# Patient Record
Sex: Female | Born: 1995 | Race: White | Hispanic: No | Marital: Married | Smoking: Never smoker
Health system: Southern US, Community
[De-identification: ages and names within clinical notes are randomized; demographics above are authoritative.]

## PROBLEM LIST (undated history)

## (undated) DIAGNOSIS — J45909 Unspecified asthma, uncomplicated: Secondary | ICD-10-CM

## (undated) DIAGNOSIS — G43909 Migraine, unspecified, not intractable, without status migrainosus: Secondary | ICD-10-CM

## (undated) HISTORY — DX: Migraine, unspecified, not intractable, without status migrainosus: G43.909

## (undated) HISTORY — DX: Unspecified asthma, uncomplicated: J45.909

---
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
## 2013-08-03 HISTORY — PX: SHOULDER ARTHROSCOPY W/ SUPERIOR LABRAL ANTERIOR POSTERIOR LESION REPAIR: SHX2402

## 2014-08-03 HISTORY — PX: OTHER SURGICAL HISTORY: SHX169

## 2016-03-24 LAB — VITAMIN D 25 HYDROXY (VIT D DEFICIENCY, FRACTURES): VIT D 25 HYDROXY: 46.8

## 2016-08-03 HISTORY — PX: OTHER SURGICAL HISTORY: SHX169

## 2017-01-21 ENCOUNTER — Encounter: Payer: Self-pay | Admitting: Physician Assistant

## 2017-01-21 ENCOUNTER — Encounter: Payer: Self-pay | Admitting: Family Medicine

## 2017-08-26 LAB — BASIC METABOLIC PANEL
GLUCOSE: 86
POTASSIUM: 3.9 (ref 3.4–5.3)

## 2017-08-26 LAB — TSH: TSH: 1.35 (ref <=5.90)

## 2017-08-26 LAB — CBC AND DIFFERENTIAL: Hemoglobin: 13.3 (ref 12.0–16.0)

## 2018-03-08 ENCOUNTER — Other Ambulatory Visit (HOSPITAL_COMMUNITY)
Admission: RE | Admit: 2018-03-08 | Discharge: 2018-03-08 | Disposition: A | Discharge status: Home / Self Care | Payer: BC Managed Care – PPO | Source: Ambulatory Visit | Attending: Family Medicine | Admitting: Family Medicine

## 2018-03-08 ENCOUNTER — Encounter: Payer: Self-pay | Admitting: Family Medicine

## 2018-03-08 ENCOUNTER — Ambulatory Visit (INDEPENDENT_AMBULATORY_CARE_PROVIDER_SITE_OTHER): Payer: BC Managed Care – PPO | Admitting: Family Medicine

## 2018-03-08 VITALS — BP 102/78 | HR 85 | Temp 98.3°F | Ht 64.0 in | Wt 126.8 lb

## 2018-03-08 DIAGNOSIS — N644 Mastodynia: Secondary | ICD-10-CM

## 2018-03-08 DIAGNOSIS — F411 Generalized anxiety disorder: Secondary | ICD-10-CM | POA: Insufficient documentation

## 2018-03-08 DIAGNOSIS — Z113 Encounter for screening for infections with a predominantly sexual mode of transmission: Secondary | ICD-10-CM

## 2018-03-08 DIAGNOSIS — Z114 Encounter for screening for human immunodeficiency virus [HIV]: Secondary | ICD-10-CM | POA: Diagnosis not present

## 2018-03-08 NOTE — Progress Notes (Addendum)
Patient: Sheri Wilson MRN: [id number] DOB: 02-Aug-1996 PCP: Orland Mustard, MD     Subjective:  Chief Complaint  Patient presents with  . Establish Care  . pain in R breast    HPI: The patient is a 22 y.o. female who presents today for establishing care and right breast pain.   Right breast pain: started about 1.5 months ago. Located in upper/outer quadrant. Pain is constant and is worse with pressure/working out. She is unable to do any cardio b/c of the pain. No trauma and can not recall any precipitating event. Stays in that area and is a little under her under arm. Nothing seems to help. She has tried ibuprofen a few time and a heating pad once. This did not seem to help it. It even hurts to lay on her stomach/side. Nothing makes it better. Pain is intermittent, but when sitting would be a 1/10 and described as dull. When it's hurting it's a 6/10 and sharp in nature. She states it's really hard in the area that hurts, but no lump. No hx of breast cancer in first degree relatives. No nipple discharge. No change on her cycle to area.    GAD: on buspar and extremely well controlled. Desires no change to medication and doe snot need a refill at this time.   There is no immunization history on file for this patient.  Pap smear: 04/2017. wnl  Tdap: 2018 Gc/c: today Hiv: today    Review of Systems  Constitutional: Negative for activity change and fatigue.  Respiratory: Negative for shortness of breath.   Cardiovascular: Negative for chest pain.  Gastrointestinal: Negative for abdominal pain and nausea.  Genitourinary:       Right breast pain   Neurological: Negative for dizziness and headaches.  Psychiatric/Behavioral: Negative for sleep disturbance. The patient is not nervous/anxious.     Allergies Patient is allergic to indomethacin and sulfa antibiotics.  Past Medical History Patient  has a past medical history of Asthma and Migraines.  Surgical History Patient  has a  past surgical history that includes Arthrodesis foot with iliac crest bone graft (2011); Sesamoidectomy (Left, 2013); Shoulder arthroscopy w/ superior labral anterior posterior repair (Right, 2014); Shoulder arthroscopy w/ superior labral anterior posterior lesion repair (Right, 2015); fai repair (Right, 2016); and FAI repair (Right, 2018).  Family History [name]'s family history is not on file.  Social History Patient  reports that she has never smoked. She has never used smokeless tobacco. She reports that she drinks alcohol. She reports that she does not use drugs.    Objective: Vitals:   03/08/18 1437  BP: 102/78  Pulse: 85  Temp: 98.3 F (36.8 C)  TempSrc: Oral  SpO2: 100%  Weight: 126 lb 12.8 oz (57.5 kg)  Height: 5\' 4"  (1.626 m)    Body mass index is 21.77 kg/m.  Physical Exam  Constitutional: She is oriented to person, place, and time. She appears well-developed and well-nourished.  HENT:  Right Ear: External ear normal.  Left Ear: External ear normal.  Mouth/Throat: Oropharynx is clear and moist.  Tm pearly with light reflex bilaterally   Neck: Normal range of motion. Neck supple.  Cardiovascular: Normal rate, regular rhythm and normal heart sounds.  Pulmonary/Chest: Effort normal and breath sounds normal. Right breast exhibits tenderness. Right breast exhibits no inverted nipple, no nipple discharge and no skin change.  Area is tender and indurated to palpation. No round mass, feels more fibrous. Approximately 3-4cm in diameter.     Abdominal:  Soft. Bowel sounds are normal.  Lymphadenopathy:    She has no cervical adenopathy.    She has no axillary adenopathy.  Neurological: She is alert and oriented to person, place, and time. No cranial nerve deficit. Coordination normal.  Skin: Skin is warm and dry. No rash noted.  Vitals reviewed.  GAD 7 : Generalized Anxiety Score 03/08/2018  Nervous, Anxious, on Edge 1  Control/stop worrying 0  Worry too much - different  things 0  Trouble relaxing 0  Restless 0  Easily annoyed or irritable 0  Afraid - awful might happen 0  Total GAD 7 Score 1  Anxiety Difficulty Not difficult at all        Assessment/plan: 1. Breast pain, right Will ultrasound this. Feels more fibrous in nature. May need to see breast surgeon. Discussed ibuprofen, heat and decreasing caffeine. Will f/u on results.  - US Unlisted Procedure Breast; Future  2. Encounter for screening for HIV  - HIV antibody  3. Screen for STD (sexually transmitted disease)  - Urine cytology ancillary only; Future  4. Generalized anxiety disorder Well controlled on her buspar. GAD7 non significant and also shows her to be well controlled. Let me know when she needs refills.    Return if symptoms worsen or fail to improve.     Orland MustardAllison Donabelle Molden, MD [facility]  03/08/2018

## 2018-03-09 LAB — HIV ANTIBODY (ROUTINE TESTING W REFLEX): HIV 1&2 Ab, 4th Generation: NONREACTIVE

## 2018-03-10 LAB — URINE CYTOLOGY ANCILLARY ONLY
Chlamydia: NEGATIVE
Neisseria Gonorrhea: NEGATIVE

## 2018-03-16 NOTE — Addendum Note (Signed)
Addended by: Orland MustardWOLFE, Kael Keetch on: 03/16/2018 02:09 PM   Modules accepted: Level of Service

## 2018-03-21 ENCOUNTER — Telehealth: Payer: Self-pay | Admitting: Family Medicine

## 2018-03-21 ENCOUNTER — Other Ambulatory Visit: Payer: Self-pay

## 2018-03-21 ENCOUNTER — Encounter: Payer: Self-pay | Admitting: Family Medicine

## 2018-03-21 DIAGNOSIS — Z13220 Encounter for screening for lipoid disorders: Secondary | ICD-10-CM

## 2018-03-21 DIAGNOSIS — G43009 Migraine without aura, not intractable, without status migrainosus: Secondary | ICD-10-CM | POA: Insufficient documentation

## 2018-03-21 DIAGNOSIS — E559 Vitamin D deficiency, unspecified: Secondary | ICD-10-CM

## 2018-03-21 NOTE — Telephone Encounter (Signed)
Let her know that I got her records and no cholesterol lab check has been done. Would do this as well as a vitamin D check. If she wants to come in I can order these for her. Needs to be fasting x 8 hours, but water and coffee okay.

## 2018-03-23 NOTE — Telephone Encounter (Signed)
Called pt and informed her on 8/19 that Dr. Artis FlockWolfe has received her records, but that no cholesterol screen was tested previously. She verbalized understanding.  Lab orders put in for Lipid panel and a Vitamin D level also.  Lab appt scheduled for 8/22.

## 2018-03-24 ENCOUNTER — Other Ambulatory Visit (INDEPENDENT_AMBULATORY_CARE_PROVIDER_SITE_OTHER): Payer: BC Managed Care – PPO

## 2018-03-24 ENCOUNTER — Ambulatory Visit
Admission: RE | Admit: 2018-03-24 | Discharge: 2018-03-24 | Disposition: A | Discharge status: Home / Self Care | Payer: BC Managed Care – PPO | Source: Ambulatory Visit | Attending: Family Medicine | Admitting: Family Medicine

## 2018-03-24 DIAGNOSIS — Z13220 Encounter for screening for lipoid disorders: Secondary | ICD-10-CM

## 2018-03-24 DIAGNOSIS — N644 Mastodynia: Secondary | ICD-10-CM

## 2018-03-24 DIAGNOSIS — E559 Vitamin D deficiency, unspecified: Secondary | ICD-10-CM

## 2018-03-24 LAB — LIPID PANEL
CHOLESTEROL: 153 mg/dL (ref 0–200)
HDL: 67 mg/dL (ref >39.00)
LDL CALC: 77 mg/dL (ref 0–99)
NonHDL: 86.29
TRIGLYCERIDES: 47 mg/dL (ref 0.0–149.0)
Total CHOL/HDL Ratio: 2
VLDL: 9.4 mg/dL (ref 0.0–40.0)

## 2018-03-24 LAB — VITAMIN D 25 HYDROXY (VIT D DEFICIENCY, FRACTURES): VITD: 47.37 ng/mL (ref 30.00–100.00)

## 2018-05-11 ENCOUNTER — Ambulatory Visit (INDEPENDENT_AMBULATORY_CARE_PROVIDER_SITE_OTHER): Payer: BC Managed Care – PPO | Admitting: Family Medicine

## 2018-05-11 ENCOUNTER — Encounter: Payer: Self-pay | Admitting: Family Medicine

## 2018-05-11 VITALS — BP 104/68 | HR 70 | Temp 98.3°F | Ht 64.0 in | Wt 126.0 lb

## 2018-05-11 DIAGNOSIS — G43019 Migraine without aura, intractable, without status migrainosus: Secondary | ICD-10-CM

## 2018-05-11 DIAGNOSIS — N644 Mastodynia: Secondary | ICD-10-CM

## 2018-05-11 MED ORDER — KETOROLAC TROMETHAMINE 60 MG/2ML IM SOLN
60.0000 mg | Freq: Once | INTRAMUSCULAR | Status: AC
  Administered 2018-05-11: 60 mg via INTRAMUSCULAR

## 2018-05-11 MED ORDER — ELETRIPTAN HYDROBROMIDE 20 MG PO TABS: 20.0000 mg | ORAL_TABLET | ORAL | 0 refills | Status: DC | PRN

## 2018-05-11 NOTE — Progress Notes (Signed)
Patient: Sheri Wilson MRN: [id number] DOB: Apr 13, 1996 PCP: Orland Mustard, MD     Subjective:  Chief Complaint  Patient presents with  . right breast pain    seen 03/2018 for same issue    HPI: The patient is a 22 y.o. female who presents today for right sided breast pain which has not changed since I saw her in 03/2018. Ultrasound was normal. She now states the pain has increased in diameter and sometimes her nipple throbs. No redness or discharge. Pain not correlated with period and pain is constant. Pain rated as a 6/10 when pressure applied. Hurts even with no bra. Running with a sports bra hurts pretty bad. Nothing has helped relief the pain including nsaids, heating pad, etc.   She has a history of migraines and is currently on imitrex. She needs this weekly. She states they are increasing and she does not like the imitrex. She feels really bad and achy/weak after taking the imitrex and would like a different abortive medication.  She has kept a headache log and can not find any correlation to anything. She has never been on Topamax or any other preventative medication  . Headaches are her typical migraines. She does not get auras. Headache is located behind her eyes. She does have photophobia and phonophobia. She gets nauseated and a lot of times cant keep stuff down. She has some phenergan prn. No focal deficits, no waking her up and not worst headache of her life.    Review of Systems  Constitutional: Negative for chills and fever.  Eyes: Positive for photophobia. Negative for visual disturbance.  Respiratory: Negative for shortness of breath.   Cardiovascular: Negative for chest pain.       Right sided breast pain  Gastrointestinal: Negative for abdominal pain, diarrhea, nausea and vomiting.  Musculoskeletal: Negative for neck stiffness.  Neurological: Positive for headaches. Negative for dizziness, syncope and facial asymmetry.    Allergies Patient is allergic to  indomethacin and sulfa antibiotics.  Past Medical History Patient  has a past medical history of Asthma and Migraines.  Surgical History Patient  has a past surgical history that includes Arthrodesis foot with iliac crest bone graft (2011); Sesamoidectomy (Left, 2013); Shoulder arthroscopy w/ superior labral anterior posterior repair (Right, 2014); Shoulder arthroscopy w/ superior labral anterior posterior lesion repair (Right, 2015); fai repair (Right, 2016); and FAI repair (Right, 2018).  Family History [name]'s family history is not on file.  Social History Patient  reports that she has never smoked. She has never used smokeless tobacco. She reports that she drinks alcohol. She reports that she does not use drugs.    Objective: Vitals:   05/11/18 1449  BP: 104/68  Pulse: 70  Temp: 98.3 F (36.8 C)  TempSrc: Oral  SpO2: 99%  Weight: 126 lb (57.2 kg)  Height: 5\' 4"  (1.626 m)    Body mass index is 21.63 kg/m.  Physical Exam  Constitutional: She is oriented to person, place, and time. She appears well-developed and well-nourished.  Eyes: Pupils are equal, round, and reactive to light. Conjunctivae and EOM are normal.  Neck: Normal range of motion. Neck supple.  Cardiovascular: Normal rate, regular rhythm and normal heart sounds.  Pulmonary/Chest: Effort normal and breath sounds normal. Right breast exhibits tenderness. Right breast exhibits no inverted nipple, no nipple discharge and no skin change. Left breast exhibits no inverted nipple, no nipple discharge and no skin change.  Right breast: firm tissue that is tender on palpation on lateral/superior aspect  of breast and adjacent to nipple.     Abdominal: Soft. Bowel sounds are normal.  Neurological: She is alert and oriented to person, place, and time. No cranial nerve deficit.  Psychiatric: She has a normal mood and affect. Her behavior is normal.  Vitals reviewed.      Assessment/plan: 1. Intractable migraine  without aura and without status migrainosus toradol shot today and will change her to relpax. Let me know if too expensive. If headaches get worse may need to start a preventative medication. Already keeps a headache log. discussed if character of headache changes may need imaging. Also discussed new immunomodulator injectables for migraines. Precautions given.  - ketorolac (TORADOL) injection 60 mg  2. Breast pain, right Abnormal tissue causing her pain. Ultrasound normal. Will send her to breast surgeon for further care for imaging vs. More invasive care.  - Ambulatory referral to Plastic Surgery    Return if symptoms worsen or fail to improve.   Orland Mustard, MD [facility]   05/11/2018

## 2018-05-11 NOTE — Progress Notes (Deleted)
Patient: Sheri Wilson MRN: [id number] DOB: Aug 24, 1995 PCP: Orland Mustard, MD     Subjective:  Chief Complaint  Patient presents with  . right breast pain    seen 03/2018 for same issue    HPI: The patient is a 22 y.o. female who presents today for right breast pain. I saw her on 03/08/2018 for breast pain. Ultrasound was negative. She is continuing to have pain in the area.   Review of Systems  Allergies Patient is allergic to indomethacin and sulfa antibiotics.  Past Medical History Patient  has a past medical history of Asthma and Migraines.  Surgical History Patient  has a past surgical history that includes Arthrodesis foot with iliac crest bone graft (2011); Sesamoidectomy (Left, 2013); Shoulder arthroscopy w/ superior labral anterior posterior repair (Right, 2014); Shoulder arthroscopy w/ superior labral anterior posterior lesion repair (Right, 2015); fai repair (Right, 2016); and FAI repair (Right, 2018).  Family History [name]'s family history is not on file.  Social History Patient  reports that she has never smoked. She has never used smokeless tobacco. She reports that she drinks alcohol. She reports that she does not use drugs.    Objective: Vitals:   05/11/18 1449  BP: 104/68  Pulse: 70  Temp: 98.3 F (36.8 C)  TempSrc: Oral  SpO2: 99%  Weight: 126 lb (57.2 kg)  Height: 5\' 4"  (1.626 m)    Body mass index is 21.63 kg/m.  Physical Exam     Assessment/plan:      No follow-ups on file.     Orland Mustard, MD [facility]  05/11/2018

## 2018-05-24 ENCOUNTER — Encounter: Payer: Self-pay | Admitting: Plastic Surgery

## 2018-05-24 ENCOUNTER — Ambulatory Visit (INDEPENDENT_AMBULATORY_CARE_PROVIDER_SITE_OTHER): Payer: BC Managed Care – PPO | Admitting: Plastic Surgery

## 2018-05-24 ENCOUNTER — Other Ambulatory Visit: Payer: Self-pay

## 2018-05-24 VITALS — BP 118/82 | HR 82 | Wt 127.0 lb

## 2018-05-24 DIAGNOSIS — N6310 Unspecified lump in the right breast, unspecified quadrant: Secondary | ICD-10-CM

## 2018-05-24 DIAGNOSIS — N63 Unspecified lump in unspecified breast: Secondary | ICD-10-CM | POA: Diagnosis not present

## 2018-05-24 DIAGNOSIS — E559 Vitamin D deficiency, unspecified: Secondary | ICD-10-CM | POA: Insufficient documentation

## 2018-05-24 DIAGNOSIS — E538 Deficiency of other specified B group vitamins: Secondary | ICD-10-CM | POA: Insufficient documentation

## 2018-05-24 DIAGNOSIS — G8929 Other chronic pain: Secondary | ICD-10-CM

## 2018-05-24 DIAGNOSIS — N644 Mastodynia: Secondary | ICD-10-CM

## 2018-05-25 ENCOUNTER — Encounter: Payer: Self-pay | Admitting: Plastic Surgery

## 2018-05-25 NOTE — Progress Notes (Signed)
Patient ID: Sheri Wilson, female    DOB: 1996/03/17, 22 y.o.   MRN: [id number]   Chief Complaint  Patient presents with  . Breast Mass    6-7 months right side mass    The patient is a 22 yrs old wf here for evaluation / consultation for right breast pain.  She states it has been present for ~ 1 year.  It is getting worse and starting to wake her up at night if she rolls onto the area.  It started with tenderness and firmness at the upper outer quadrant to now include the upper central and inner quadrant.  It is firm to touch and tender.  It is ~ 4 x 6 cm in size.  There is no nipple discharge.  No sign of skin change.  The breast appears the same size as the left breast.  She denies any trauma.  She is on estrogen for migraine control per the patient.  She does not have any family history of breast cancer.  She has not had a mammogram or MRI.  She had an ultrasound and was told there was nothing of concern.  At this point nothing makes it better and it does not seem to be cyclical.    Review of Systems  Constitutional: Positive for activity change. Negative for appetite change.  HENT: Negative.   Eyes: Negative.   Respiratory: Negative.   Cardiovascular: Negative.   Gastrointestinal: Negative.  Negative for abdominal pain.  Endocrine: Negative.   Genitourinary: Negative.   Musculoskeletal: Negative.  Negative for back pain.  Skin: Negative for color change and wound.  Neurological: Positive for headaches.  Hematological: Negative.   Psychiatric/Behavioral: Negative.     Past Medical History:  Diagnosis Date  . Asthma   . Migraines     Past Surgical History:  Procedure Laterality Date  . ARTHRODESIS FOOT WITH ILIAC CREST BONE GRAFT  2011   bone graft L foot sesamoid bone  . fai repair Right 2016   FAI repair right hip  . FAI repair Right 2018   FAI repair/labrum repair right hip  . SESAMOIDECTOMY Left 2013  . SHOULDER ARTHROSCOPY W/ SUPERIOR LABRAL ANTERIOR POSTERIOR  LESION REPAIR Right 2015  . SHOULDER ARTHROSCOPY W/ SUPERIOR LABRAL ANTERIOR POSTERIOR REPAIR Right 2014      Current Outpatient Medications:  .  busPIRone (BUSPAR) 7.5 MG tablet, Take 7.5 mg by mouth daily., Disp: , Rfl:  .  eletriptan (RELPAX) 20 MG tablet, Take 1 tablet (20 mg total) by mouth as needed for migraine or headache. May repeat in 2 hours if headache persists or recurs., Disp: 10 tablet, Rfl: 0 .  Norethindrone-Ethinyl Estradiol-Fe Biphas (LO LOESTRIN FE) 1 MG-10 MCG / 10 MCG tablet, TAKE 1 TABLET BY MOUTH EVERY DAY, Disp: , Rfl:    Objective:   Vitals:   05/24/18 1506  BP: 118/82  Pulse: 82  SpO2: 97%    Physical Exam  Constitutional: She is oriented to person, place, and time. She appears well-developed and well-nourished.  HENT:  Head: Normocephalic and atraumatic.  Eyes: Pupils are equal, round, and reactive to light. EOM are normal.  Cardiovascular: Normal rate.  Pulmonary/Chest: Effort normal.  Abdominal: Soft. There is no tenderness.  Musculoskeletal: She exhibits no edema, tenderness or deformity.  Neurological: She is alert and oriented to person, place, and time.  Skin: Skin is warm. No rash noted. No erythema.  Psychiatric: She has a normal mood and affect. Her behavior is  normal. Judgment and thought content normal.    Assessment & Plan:  Breast mass, right  Chronic breast pain  Recommend GYN visit.  May need hormone meds / estrogen altered as it may be a contributing factor although not sure why it would be unilateral.  Appreciate GYN input and evaluation.  Recommend mammogram.  Referral to Gen surgery as well for evaluation.  Increase protein intake.    Alena Bills Dillingham, DO

## 2018-06-22 ENCOUNTER — Encounter: Payer: Self-pay | Admitting: Family Medicine

## 2018-06-22 ENCOUNTER — Ambulatory Visit (INDEPENDENT_AMBULATORY_CARE_PROVIDER_SITE_OTHER): Payer: BC Managed Care – PPO | Admitting: Family Medicine

## 2018-06-22 VITALS — BP 100/68 | HR 74 | Temp 98.0°F | Ht 64.0 in | Wt 128.0 lb

## 2018-06-22 DIAGNOSIS — R0982 Postnasal drip: Secondary | ICD-10-CM

## 2018-06-22 DIAGNOSIS — J029 Acute pharyngitis, unspecified: Secondary | ICD-10-CM

## 2018-06-22 LAB — POCT RAPID STREP A (OFFICE): RAPID STREP A SCREEN: NEGATIVE

## 2018-06-22 MED ORDER — FLUTICASONE PROPIONATE 50 MCG/ACT NA SUSP: 2.0000 | Freq: Every day | NASAL | 6 refills | Status: DC

## 2018-06-22 MED ORDER — BETAMETHASONE SOD PHOS & ACET 6 (3-3) MG/ML IJ SUSP
12.0000 mg | Freq: Once | INTRAMUSCULAR | Status: AC
  Administered 2018-06-22: 12 mg via INTRAMUSCULAR

## 2018-06-22 NOTE — Patient Instructions (Signed)

## 2018-06-22 NOTE — Progress Notes (Signed)
Patient: Sheri Wilson MRN: [id number] DOB: 01/28/1996 PCP: Orland MustardWolfe, Kiara Mcdowell, MD     Subjective:  Chief Complaint  Patient presents with  . Sore Throat  . Generalized Body Aches    HPI: The patient is a 22 y.o. female who presents today for sore throat and body aches. She started feeling bad yesterday with a sore throat. She states it got worse last night and woke up and it was really bad this AM with pain and body aches. She has a mild cough that started about 2 hours ago that is dry in nature. She has nasal congestion, but denies any sinus pain/pressure. One of her kids is sick at school. No one else is sick. 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 She took tylenol around 5:30AM. No N/V/D. No abdominal pain. No shortness of breath or wheezing.   Review of Systems  Constitutional: Negative for chills and fever.  HENT: Positive for congestion and sore throat. Negative for ear pain, rhinorrhea, sinus pressure and sinus pain.   Eyes: Negative for itching.  Respiratory: Positive for cough. Negative for shortness of breath and wheezing.   Gastrointestinal: Negative for abdominal pain, nausea and vomiting.    Allergies Patient is allergic to indomethacin and sulfa antibiotics.  Past Medical History Patient  has a past medical history of Asthma and Migraines.  Surgical History Patient  has a past surgical history that includes Arthrodesis foot with iliac crest bone graft (2011); Sesamoidectomy (Left, 2013); Shoulder arthroscopy w/ superior labral anterior posterior repair (Right, 2014); Shoulder arthroscopy w/ superior labral anterior posterior lesion repair (Right, 2015); fai repair (Right, 2016); and FAI repair (Right, 2018).  Family History [name]'s family history is not on file.  Social History Patient  reports that she has never smoked. She has never used smokeless tobacco. She reports that she drinks alcohol. She reports that she does not use  drugs.    Objective: Vitals:   06/22/18 1132  BP: 100/68  Pulse: 74  Temp: 98 F (36.7 C)  TempSrc: Oral  SpO2: 97%  Weight: 128 lb (58.1 kg)  Height: 5\' 4"  (1.626 m)    Body mass index is 21.97 kg/m.  Physical Exam  Constitutional: She appears well-developed and well-nourished.  HENT:  Right Ear: Tympanic membrane and ear canal normal.  Left Ear: Tympanic membrane and ear canal normal.  Mouth/Throat: Mucous membranes are normal. No oral lesions. Posterior oropharyngeal erythema present. No oropharyngeal exudate or posterior oropharyngeal edema. Tonsils are 0 on the right. Tonsils are 0 on the left. No tonsillar exudate.  +cobblestoning on posterior pharynx No exudate on tonsils   Neck: Normal range of motion.  Cardiovascular: Normal rate, regular rhythm and normal heart sounds.  Pulmonary/Chest: Effort normal and breath sounds normal.  Abdominal: Soft. Bowel sounds are normal.  Lymphadenopathy:    She has no cervical adenopathy.  Skin: No rash noted.  Vitals reviewed.     Rapid strep: negative   Assessment/plan:  1) post nasal drip Starting flonase daily. Discussed this can make her throat very sore.   2. Viral pharyngitis Steroid shot and conservative therapy with cool mist humidifier, warm salt water gurgles, honey and otc medication. Fever, chills, worsening symptoms she is to let me know.  - betamethasone acetate-betamethasone sodium phosphate (CELESTONE) injection 12 mg   Return if symptoms worsen or fail to improve.   Orland MustardAllison Ivaan Liddy, MD [facility]   06/22/2018

## 2018-06-27 ENCOUNTER — Ambulatory Visit (INDEPENDENT_AMBULATORY_CARE_PROVIDER_SITE_OTHER): Payer: BC Managed Care – PPO | Admitting: Physician Assistant

## 2018-06-27 ENCOUNTER — Encounter: Payer: Self-pay | Admitting: Physician Assistant

## 2018-06-27 VITALS — BP 108/76 | HR 70 | Temp 98.4°F | Ht 64.0 in | Wt 127.2 lb

## 2018-06-27 DIAGNOSIS — R05 Cough: Secondary | ICD-10-CM | POA: Diagnosis not present

## 2018-06-27 DIAGNOSIS — R059 Cough, unspecified: Secondary | ICD-10-CM

## 2018-06-27 MED ORDER — AZITHROMYCIN 250 MG PO TABS: ORAL_TABLET | ORAL | 0 refills | Status: DC

## 2018-06-27 MED ORDER — HYDROCOD POLST-CPM POLST ER 10-8 MG/5ML PO SUER: 5.0000 mL | Freq: Every evening | ORAL | 0 refills | Status: DC | PRN

## 2018-06-27 NOTE — Patient Instructions (Signed)
It was great to see you!  Please use the prescription cough syrup as directed, only at home, as it can make you drowsy.  If no improvement, as we discussed, may use antibiotic.  Push fluids and get plenty of rest. Please return if you are not improving as expected, or if you have high fevers (>101.5) or difficulty swallowing or worsening productive cough.  Call clinic with questions.  I hope you start feeling better soon!

## 2018-06-27 NOTE — Progress Notes (Signed)
Sheri Wilson is a 22 y.o. female here for a new problem.  History of Present Illness:   Chief Complaint  Patient presents with  . Cough    Hoarseness,headached    HPI   Patient was seen by her PCP on 11/20 and was given a depomedrol injection as well as a prescription of flonase. She has been tolerating this well but now has a cough that is keeping her up at night. Her voice has returned but remains hoarse, she suspects, because she is not sleeping well from coughing all night. She tried Delsym and it is not working well. Has a past history of asthma that she "grew out of." Denies: fevers, chills, CP, SOB.    Past Medical History:  Diagnosis Date  . Asthma   . Migraines      Social History   Socioeconomic History  . Marital status: Single    Spouse name: Not on file  . Number of children: Not on file  . Years of education: Not on file  . Highest education level: Not on file  Occupational History  . Not on file  Social Needs  . Financial resource strain: Not on file  . Food insecurity:    Worry: Not on file    Inability: Not on file  . Transportation needs:    Medical: Not on file    Non-medical: Not on file  Tobacco Use  . Smoking status: Never Smoker  . Smokeless tobacco: Never Used  Substance and Sexual Activity  . Alcohol use: Yes    Comment: socially  . Drug use: Never  . Sexual activity: Yes  Lifestyle  . Physical activity:    Days per week: Not on file    Minutes per session: Not on file  . Stress: Not on file  Relationships  . Social connections:    Talks on phone: Not on file    Gets together: Not on file    Attends religious service: Not on file    Active member of club or organization: Not on file    Attends meetings of clubs or organizations: Not on file    Relationship status: Not on file  . Intimate partner violence:    Fear of current or ex partner: Not on file    Emotionally abused: Not on file    Physically abused: Not on file   Forced sexual activity: Not on file  Other Topics Concern  . Not on file  Social History Narrative  . Not on file    Past Surgical History:  Procedure Laterality Date  . ARTHRODESIS FOOT WITH ILIAC CREST BONE GRAFT  2011   bone graft L foot sesamoid bone  . fai repair Right 2016   FAI repair right hip  . FAI repair Right 2018   FAI repair/labrum repair right hip  . SESAMOIDECTOMY Left 2013  . SHOULDER ARTHROSCOPY W/ SUPERIOR LABRAL ANTERIOR POSTERIOR LESION REPAIR Right 2015  . SHOULDER ARTHROSCOPY W/ SUPERIOR LABRAL ANTERIOR POSTERIOR REPAIR Right 2014    History reviewed. No pertinent family history.  Allergies  Allergen Reactions  . Indomethacin Other (See Comments)    Vision impairment  . Sulfa Antibiotics Hives    Current Medications:   Current Outpatient Medications:  .  busPIRone (BUSPAR) 7.5 MG tablet, Take 7.5 mg by mouth daily., Disp: , Rfl:  .  eletriptan (RELPAX) 20 MG tablet, Take 1 tablet (20 mg total) by mouth as needed for migraine or headache. May repeat in 2  hours if headache persists or recurs., Disp: 10 tablet, Rfl: 0 .  fluticasone (FLONASE) 50 MCG/ACT nasal spray, Place 2 sprays into both nostrils daily., Disp: 16 g, Rfl: 6 .  Norethindrone-Ethinyl Estradiol-Fe Biphas (LO LOESTRIN FE) 1 MG-10 MCG / 10 MCG tablet, TAKE 1 TABLET BY MOUTH EVERY DAY, Disp: , Rfl:  .  azithromycin (ZITHROMAX) 250 MG tablet, Take two tablets daily on day 1, then one tablet daily thereafter, Disp: 6 tablet, Rfl: 0 .  chlorpheniramine-HYDROcodone (TUSSIONEX PENNKINETIC ER) 10-8 MG/5ML SUER, Take 5 mLs by mouth at bedtime as needed for cough., Disp: 140 mL, Rfl: 0   Review of Systems:   ROS  Negative unless otherwise specified per HPI.  Vitals:   Vitals:   06/27/18 1441  BP: 108/76  Pulse: 70  Temp: 98.4 F (36.9 C)  TempSrc: Oral  SpO2: 99%  Weight: 127 lb 4 oz (57.7 kg)  Height: 5\' 4"  (1.626 m)     Body mass index is 21.84 kg/m.  Physical Exam:   Physical  Exam  Constitutional: She appears well-developed. She is cooperative.  Non-toxic appearance. She does not have a sickly appearance. She does not appear ill. No distress.  HENT:  Head: Normocephalic and atraumatic.  Right Ear: External ear and ear canal normal. Tympanic membrane is not erythematous, not retracted and not bulging. A middle ear effusion (fluid appears clear) is present.  Left Ear: Tympanic membrane, external ear and ear canal normal. Tympanic membrane is not erythematous, not retracted and not bulging.  Nose: Mucosal edema and rhinorrhea present. Right sinus exhibits no maxillary sinus tenderness and no frontal sinus tenderness. Left sinus exhibits no maxillary sinus tenderness and no frontal sinus tenderness.  Mouth/Throat: Uvula is midline and mucous membranes are normal. Posterior oropharyngeal erythema present. No posterior oropharyngeal edema. Tonsils are 0 on the right. Tonsils are 0 on the left.  Eyes: Conjunctivae and lids are normal.  Neck: Trachea normal.  Cardiovascular: Normal rate, regular rhythm, S1 normal, S2 normal and normal heart sounds.  Pulmonary/Chest: Effort normal and breath sounds normal. She has no decreased breath sounds. She has no wheezes. She has no rhonchi. She has no rales.  Lungs clear to auscultation bilaterally, good breath sounds throughout   Lymphadenopathy:    She has no cervical adenopathy.  Neurological: She is alert.  Skin: Skin is warm, dry and intact.  Psychiatric: She has a normal mood and affect. Her speech is normal and behavior is normal.  Nursing note and vitals reviewed.   Assessment and Plan:   Claris GowerCharlotte was seen today for cough.  Diagnoses and all orders for this visit:  Cough  Other orders -     chlorpheniramine-HYDROcodone (TUSSIONEX PENNKINETIC ER) 10-8 MG/5ML SUER; Take 5 mLs by mouth at bedtime as needed for cough. -     azithromycin (ZITHROMAX) 250 MG tablet; Take two tablets daily on day 1, then one tablet daily  thereafter   No red flags on exam.  Will initiate Tussionex per orders -- advised precautions regarding this medication in regards to drowsiness potential. I also provided her with a pocket prescriptioin of azithromycin should her symptoms not improve or worsen throughout the holiday weekend. Discussed taking medications as prescribed. Reviewed return precautions including worsening fever, SOB, worsening cough or other concerns. Push fluids and rest. I recommend that patient follow-up if symptoms worsen or persist despite treatment x 7-10 days, sooner if needed.  . Reviewed expectations re: course of current medical issues. . Discussed self-management of  symptoms. . Outlined signs and symptoms indicating need for more acute intervention. . Patient verbalized understanding and all questions were answered. . See orders for this visit as documented in the electronic medical record. . Patient received an After-Visit Summary.  Jarold Motto, PA-C

## 2018-07-03 HISTORY — PX: BREAST SURGERY: SHX581

## 2018-07-05 ENCOUNTER — Encounter: Payer: Self-pay | Admitting: Physician Assistant

## 2018-07-05 ENCOUNTER — Ambulatory Visit (INDEPENDENT_AMBULATORY_CARE_PROVIDER_SITE_OTHER): Payer: BC Managed Care – PPO | Admitting: Physician Assistant

## 2018-07-05 VITALS — BP 110/62 | HR 78 | Temp 99.0°F | Ht 64.0 in | Wt 129.4 lb

## 2018-07-05 DIAGNOSIS — H1031 Unspecified acute conjunctivitis, right eye: Secondary | ICD-10-CM | POA: Diagnosis not present

## 2018-07-05 MED ORDER — POLYMYXIN B-TRIMETHOPRIM 10000-0.1 UNIT/ML-% OP SOLN: 1.0000 [drp] | Freq: Four times a day (QID) | OPHTHALMIC | 0 refills | Status: DC

## 2018-07-05 NOTE — Progress Notes (Signed)
Sheri Wilson is a 22 y.o. female here for a new problem.  History of Present Illness:   Chief Complaint  Patient presents with  . Eye Drainage    Eye Pain   Both eyes are affected.This is a new problem. The current episode started today. The problem occurs constantly. The problem has been gradually worsening. There was no injury mechanism. The pain is mild. 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 She does not wear contacts. Associated symptoms include eye redness and itching. Pertinent negatives include no blurred vision or eye discharge. She has tried nothing for the symptoms.   Denies changes in vision, intense pain or photosensitivity.   Past Medical History:  Diagnosis Date  . Asthma   . Migraines      Social History   Socioeconomic History  . Marital status: Single    Spouse name: Not on file  . Number of children: Not on file  . Years of education: Not on file  . Highest education level: Not on file  Occupational History  . Not on file  Social Needs  . Financial resource strain: Not on file  . Food insecurity:    Worry: Not on file    Inability: Not on file  . Transportation needs:    Medical: Not on file    Non-medical: Not on file  Tobacco Use  . Smoking status: Never Smoker  . Smokeless tobacco: Never Used  Substance and Sexual Activity  . Alcohol use: Yes    Comment: socially  . Drug use: Never  . Sexual activity: Yes  Lifestyle  . Physical activity:    Days per week: Not on file    Minutes per session: Not on file  . Stress: Not on file  Relationships  . Social connections:    Talks on phone: Not on file    Gets together: Not on file    Attends religious service: Not on file    Active member of club or organization: Not on file    Attends meetings of clubs or organizations: Not on file    Relationship status: Not on file  . Intimate partner violence:    Fear of current or ex partner: Not on file     Emotionally abused: Not on file    Physically abused: Not on file    Forced sexual activity: Not on file  Other Topics Concern  . Not on file  Social History Narrative  . Not on file    Past Surgical History:  Procedure Laterality Date  . ARTHRODESIS FOOT WITH ILIAC CREST BONE GRAFT  2011   bone graft L foot sesamoid bone  . fai repair Right 2016   FAI repair right hip  . FAI repair Right 2018   FAI repair/labrum repair right hip  . SESAMOIDECTOMY Left 2013  . SHOULDER ARTHROSCOPY W/ SUPERIOR LABRAL ANTERIOR POSTERIOR LESION REPAIR Right 2015  . SHOULDER ARTHROSCOPY W/ SUPERIOR LABRAL ANTERIOR POSTERIOR REPAIR Right 2014    No family history on file.  Allergies  Allergen Reactions  . Indomethacin Other (See Comments)    Vision impairment  . Sulfa Antibiotics Hives    Current Medications:   Current Outpatient Medications:  .  busPIRone (BUSPAR) 7.5 MG tablet, Take 7.5 mg by mouth daily., Disp: , Rfl:  .  eletriptan (RELPAX) 20 MG tablet, Take 1 tablet (20 mg total) by mouth as needed for migraine or headache. May repeat in 2 hours  if headache persists or recurs., Disp: 10 tablet, Rfl: 0 .  Norethindrone-Ethinyl Estradiol-Fe Biphas (LO LOESTRIN FE) 1 MG-10 MCG / 10 MCG tablet, TAKE 1 TABLET BY MOUTH EVERY DAY, Disp: , Rfl:  .  trimethoprim-polymyxin b (POLYTRIM) ophthalmic solution, Place 1 drop into both eyes every 6 (six) hours., Disp: 10 mL, Rfl: 0   Review of Systems:   Review of Systems  Eyes: Positive for pain, redness and itching. Negative for blurred vision and discharge.    Vitals:   Vitals:   07/05/18 1517  BP: 110/62  Pulse: 78  Temp: 99 F (37.2 C)  TempSrc: Oral  SpO2: 99%  Weight: 129 lb 6.4 oz (58.7 kg)  Height: 5\' 4"  (1.626 m)     Body mass index is 22.21 kg/m.  Physical Exam:   Physical Exam  Constitutional: She appears well-developed. She is cooperative.  Non-toxic appearance. She does not have a sickly appearance. She does not  appear ill. No distress.  HENT:  Head: Normocephalic and atraumatic.  Right Ear: Tympanic membrane, external ear and ear canal normal. Tympanic membrane is not erythematous, not retracted and not bulging.  Left Ear: Tympanic membrane, external ear and ear canal normal. Tympanic membrane is not erythematous, not retracted and not bulging.  Nose: Nose normal. Right sinus exhibits no maxillary sinus tenderness and no frontal sinus tenderness. Left sinus exhibits no maxillary sinus tenderness and no frontal sinus tenderness.  Mouth/Throat: Uvula is midline. No posterior oropharyngeal edema or posterior oropharyngeal erythema.  Eyes: Pupils are equal, round, and reactive to light. EOM and lids are normal. Right conjunctiva is injected. Right conjunctiva has no hemorrhage. Left conjunctiva is injected. Left conjunctiva has no hemorrhage. Right eye exhibits normal extraocular motion. Left eye exhibits normal extraocular motion.  Bilateral conjunctivae with redness R>>L  Neck: Trachea normal.  Cardiovascular: Normal rate, regular rhythm, S1 normal, S2 normal and normal heart sounds.  Pulmonary/Chest: Effort normal and breath sounds normal. She has no decreased breath sounds. She has no wheezes. She has no rhonchi. She has no rales.  Lymphadenopathy:    She has no cervical adenopathy.  Neurological: She is alert.  Skin: Skin is warm, dry and intact.  Psychiatric: She has a normal mood and affect. Her speech is normal and behavior is normal.  Nursing note and vitals reviewed.    Assessment and Plan:   Sheri Wilson was seen today for eye drainage.  Diagnoses and all orders for this visit:  Acute conjunctivitis of right eye, unspecified acute conjunctivitis type  Other orders -     trimethoprim-polymyxin b (POLYTRIM) ophthalmic solution; Place 1 drop into both eyes every 6 (six) hours.   No red flags on exam.  Will initiate Polytrim per orders. Discussed using medications as prescribed. Reviewed  return precautions including development of fever, SOB, worsening cough or other concerns. Push fluids and rest. I recommend that patient follow-up if symptoms worsen or persist despite treatment x 7-10 days, sooner if needed.  . Reviewed expectations re: course of current medical issues. . Discussed self-management of symptoms. . Outlined signs and symptoms indicating need for more acute intervention. . Patient verbalized understanding and all questions were answered. . See orders for this visit as documented in the electronic medical record. . Patient received an After-Visit Summary.  CMA or LPN served as scribe during this visit. History, Physical, and Plan performed by medical provider. The above documentation has been reviewed and is accurate and complete.  Jarold Motto, PA-C

## 2018-07-05 NOTE — Patient Instructions (Signed)
It was great to see you!  Start the eye drops. Keep us posted on your symptoms.  Take care,  Jarold MottoSamantha Jonovan Boedecker PA-C

## 2018-07-11 ENCOUNTER — Telehealth: Payer: Self-pay | Admitting: Plastic Surgery

## 2018-07-11 ENCOUNTER — Encounter: Payer: Self-pay | Admitting: Family Medicine

## 2018-07-11 NOTE — Telephone Encounter (Signed)
Received call from patient at noon inquiring about mammogram orders. Explained to patient that I would work on same as well as referral to OB/GYN and General Surgery as she explained that she is new to the area. Patient referral placed to OB/GYN and contacted for appointment. Referred patient to [facility] with an appointment on Thursday December 12th at 8:30. Attempted to contact patient and left voice message.  In regards to the mammogram, the [facility] will not preform a screening or diagnostic mammogram for anyone under the age of 24 without immediate family history of breast cancer. I contacted Dr. Ulice Boldillingham and she is placing orders for an MRI as an ultrasound has already been performed in August. [employer] has authorized same. Will continue to try to contact patient with this information.

## 2018-07-12 NOTE — Addendum Note (Signed)
Addended by: Peggye FormILLINGHAM, Peirce Deveney S on: 07/12/2018 01:31 PM   Modules accepted: Orders

## 2018-07-12 NOTE — Addendum Note (Signed)
Addended by: Peggye FormILLINGHAM, CLAIRE S on: 07/12/2018 01:16 PM   Modules accepted: Orders

## 2018-07-13 ENCOUNTER — Encounter: Payer: Self-pay | Admitting: Obstetrics & Gynecology

## 2018-07-13 ENCOUNTER — Ambulatory Visit (INDEPENDENT_AMBULATORY_CARE_PROVIDER_SITE_OTHER): Payer: BC Managed Care – PPO | Admitting: Obstetrics & Gynecology

## 2018-07-13 ENCOUNTER — Telehealth: Payer: Self-pay

## 2018-07-13 VITALS — BP 126/66 | HR 71 | Ht 64.0 in | Wt 128.0 lb

## 2018-07-13 DIAGNOSIS — N6311 Unspecified lump in the right breast, upper outer quadrant: Secondary | ICD-10-CM

## 2018-07-13 NOTE — Telephone Encounter (Signed)
Patient called and made aware that Dr. Erin FullingHarraway-Smith consulted with radiology and placed orders for repeat breast US and scheduling fine needle aspiration same day. Patient made aware she should expect a call from the breast center to schedule this. Patient states understanding. Armandina StammerJennifer Macgregor Aeschliman RN

## 2018-07-13 NOTE — Patient Instructions (Signed)

## 2018-07-13 NOTE — Progress Notes (Signed)
History:  22 y.o. G0  here today for eval of right breast pain and beat lump. She reports that now her entire right breast hurts on the right upper and outer.  She reports that these sx have been present for >8 months. The area appear to be getting larger and is no more painful. Pt reports that she drinks 1/4 cup of coffee daily.  No h/o known trauma.  Pt is on LoEstrin Fe for >10 years.  Pt denies FH of breast dieseas. No nipple discharge. Pt has a h/o migrtains but, deies changes recently. She had breast imagine >6 months prev. She was also scheduled for a MRI of the breast but, she is worried about cost and the benefits. She was inadvertently scheduled to see a Engineer, petroleumlastic surgeon who reported that she needed to be seen by GYN. She is a bit frustrated by that visit as it was not indicated.    Pt works as a first Psychiatristgrade school teacher.   The following portions of the patient's history were reviewed and updated as appropriate: allergies, current medications, past family history, past medical history, past social history, past surgical history and problem list.  Review of Systems:  Pertinent items are noted in HPI.    Objective:  Physical Exam Blood pressure 126/66, pulse 71, height 5\' 4"  (1.626 m), weight 128 lb (58.1 kg), last menstrual period 07/01/2018.  CONSTITUTIONAL: Well-developed, well-nourished pleasant female in no acute distress.  HENT:  Normocephalic, atraumatic EYES: Conjunctivae and EOM are normal. No scleral icterus.  NECK: Normal range of motion SKIN: Skin is warm and dry. No rash noted. Not diaphoretic.No pallor. NEUROLGIC: Alert and oriented to person, place, and time. Normal coordination.   Breast: bilaterally without skin changes, axillary nodules or nipple discharge. The right upper outer quadrant has an area of ~3x3cm which is a poorly defined region of dense tissue with increased density. This area is mobile. It is slightly tender.    03/24/2018 CLINICAL DATA:  22 year old  female presenting for evaluation of worsening pain and firmness in the upper-outer quadrant of the right breast. No change in hormone status. No family history of breast cancer.  EXAM: ULTRASOUND OF THE RIGHT BREAST  COMPARISON:  None.  FINDINGS: On physical exam, there is firm tender tissue in the upper-outer quadrant of the right breast without a discrete palpable mass.  Targeted ultrasound is performed, showing normal fibroglandular tissue in the upper-outer quadrant of the right breast. No masses or suspicious areas of shadowing are identified.  IMPRESSION: Normal targeted ultrasound of the upper-outer quadrant of the right breast. No findings to explain the patient's tenderness and firmness.  RECOMMENDATION: 1. Clinical follow-up recommended for the tender palpable area of concern in the upper-outer right breast. Any further workup should be based on clinical grounds.  2. Screening mammogram at age 89 unless there are persistent or intervening clinical concerns. (Code:SM-B-40A)  I have discussed the findings and recommendations with the patient. Results were also provided in writing at the conclusion of the visit. If applicable, a reminder letter will be sent to the patient regarding the next appointment.  BI-RADS CATEGORY  1: Negative.   Assessment & Plan:  Breast mass- discussed with radiologist. Will schedule for repeat US with US guided bx.   rec f/u in 1 month or sooner prn  Total face-to-face time with patient was 20 min.  Greater than 50% was spent in counseling and coordination of care with the patient.   Willis Holquin L. Harraway-Smith, M.D., Evern CoreFACOG

## 2018-07-13 NOTE — Progress Notes (Signed)
Patient has been having breast pain since May 2019. Patient states that it is a achy pain. Patient states it is only on right side. Ultrasound of right breast done 03-2018. Patient is scheduled for breast MRI 07-28-18 but wanted OBGYN opinion and general surgeon opinon. Armandina StammerJennifer Alick Lecomte RN

## 2018-07-14 ENCOUNTER — Other Ambulatory Visit: Payer: Self-pay | Admitting: Obstetrics & Gynecology

## 2018-07-14 DIAGNOSIS — N6311 Unspecified lump in the right breast, upper outer quadrant: Secondary | ICD-10-CM

## 2018-07-15 ENCOUNTER — Encounter: Payer: Self-pay | Admitting: Obstetrics & Gynecology

## 2018-07-18 ENCOUNTER — Ambulatory Visit
Admission: RE | Admit: 2018-07-18 | Discharge: 2018-07-18 | Disposition: A | Discharge status: Home / Self Care | Payer: BC Managed Care – PPO | Source: Ambulatory Visit | Attending: Obstetrics & Gynecology | Admitting: Obstetrics & Gynecology

## 2018-07-18 DIAGNOSIS — N6311 Unspecified lump in the right breast, upper outer quadrant: Secondary | ICD-10-CM

## 2018-07-28 ENCOUNTER — Other Ambulatory Visit: Payer: BC Managed Care – PPO

## 2018-08-24 ENCOUNTER — Encounter: Payer: Self-pay | Admitting: Obstetrics & Gynecology

## 2018-08-24 ENCOUNTER — Ambulatory Visit (INDEPENDENT_AMBULATORY_CARE_PROVIDER_SITE_OTHER): Payer: BC Managed Care – PPO | Admitting: Obstetrics & Gynecology

## 2018-08-24 VITALS — BP 115/63 | HR 76 | Ht 64.0 in | Wt 132.0 lb

## 2018-08-24 DIAGNOSIS — N6310 Unspecified lump in the right breast, unspecified quadrant: Secondary | ICD-10-CM

## 2018-08-24 DIAGNOSIS — N6489 Other specified disorders of breast: Secondary | ICD-10-CM | POA: Diagnosis not present

## 2018-08-24 NOTE — Progress Notes (Signed)
History:  23 y.o. Go here for f/u of breast tenderness and breast mass. Pt is s/p a needle bx but, was not told the full results just that the results were benign. Pt reports that since the bx the area is sore with occ shooting pains. No sx currently.     The following portions of the patient's history were reviewed and updated as appropriate: allergies, current medications, past family history, past medical history, past social history, past surgical history and problem list.  Review of Systems:  Pertinent items are noted in HPI.    Objective:  Physical Exam Blood pressure 115/63, pulse 76, height 5\' 4"  (1.626 m), weight 132 lb (59.9 kg), last menstrual period 08/12/2018.  CONSTITUTIONAL: Well-developed, well-nourished female in no acute distress.  HENT:  Normocephalic, atraumatic EYES: Conjunctivae and EOM are normal. No scleral icterus.  NECK: Normal range of motion SKIN: Skin is warm and dry. No rash noted. Not diaphoretic.No pallor. NEUROLGIC: Alert and oriented to person, place, and time. Normal coordination.    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 This was found to be concordant by Dr. Amie Portland.  Pathology results were discussed with the patient by telephone. The patient reported doing well after the biopsy with tenderness and bruising at the site. Post biopsy instructions and care were reviewed and questions were answered. The patient was encouraged to call [facility] for any additional concerns.  The patient was instructed to continue with monthly self breast examinations, clinical follow-up as needed, and to return for annual mammography at 38.  Pathology  results reported by Rene Kocher, RN on 07/19/2018.  Assessment & Plan:  Breast mass- benign  Reviewed surg path PSEUDOANGIOMATOUS STROMAL HYPERPLASIA, (PASH)   F/u in 7 months for annual  Total face-to-face time with patient was .  Greater than 50% was spent in counseling and coordination of care with the patient.   Fantasha Daniele L. Harraway-Smith, M.D., [place] [place]

## 2018-08-25 ENCOUNTER — Encounter: Payer: Self-pay | Admitting: Obstetrics & Gynecology

## 2018-09-05 ENCOUNTER — Encounter: Payer: Self-pay | Admitting: Family Medicine

## 2018-09-05 ENCOUNTER — Ambulatory Visit (INDEPENDENT_AMBULATORY_CARE_PROVIDER_SITE_OTHER): Payer: BC Managed Care – PPO | Admitting: Family Medicine

## 2018-09-05 VITALS — BP 118/78 | HR 74 | Temp 98.4°F | Ht 64.0 in | Wt 131.4 lb

## 2018-09-05 DIAGNOSIS — F411 Generalized anxiety disorder: Secondary | ICD-10-CM

## 2018-09-05 MED ORDER — ELETRIPTAN HYDROBROMIDE 20 MG PO TABS: 20.0000 mg | ORAL_TABLET | ORAL | 2 refills | Status: DC | PRN

## 2018-09-05 MED ORDER — HYDROXYZINE HCL 25 MG PO TABS: 25.0000 mg | ORAL_TABLET | Freq: Three times a day (TID) | ORAL | 0 refills | Status: DC | PRN

## 2018-09-05 MED ORDER — BUSPIRONE HCL 15 MG PO TABS: 15.0000 mg | ORAL_TABLET | Freq: Two times a day (BID) | ORAL | 1 refills | Status: DC

## 2018-09-05 NOTE — Patient Instructions (Signed)
Increasing buspar to 15mg  BID. Sent in new px for this  hydroxzyine prn. Will make you drowsy. Can half tablet if you need to and take up to three times a day.   email me if you need something else.

## 2018-09-05 NOTE — Progress Notes (Signed)
Patient: Sheri Wilson MRN: [id number] DOB: [birth year] PCP: Orland Mustard, MD     Subjective:  Chief Complaint  Patient presents with  . Anxiety    HPI: The patient is a 23 y.o. female who presents today for anxiety.   She has a history of anxiety. She states it has gotten worse over this last year due to issues at work. It's keep her up at night and is really creating stress in her life. She wakes up in a full panic about going to work. She has had a history of anxiety since she was in high school. Was on SSRI for years and tapered off. She states it got worse her senior year of college. She went on prozac and got bad night sweats so she was put on another SSRI. It didn't work well again due to night sweats. At this point they changed to buspar and has been on it since day. She is having panic attacks at night that are what are waking her up. She never had panic attacks before this. Everything has been manageable until recently. She denies any hx of depression.   Review of Systems  Constitutional: Positive for fatigue.  Respiratory: Negative for shortness of breath.   Cardiovascular: Negative for chest pain and palpitations.  Gastrointestinal: Negative for abdominal pain and nausea.  Neurological: Positive for headaches. Negative for dizziness.  Psychiatric/Behavioral: Positive for sleep disturbance. Negative for dysphoric mood. The patient is nervous/anxious.     Allergies Patient is allergic to indomethacin and sulfa antibiotics.  Past Medical History Patient  has a past medical history of Asthma and Migraines.  Surgical History Patient  has a past surgical history that includes Arthrodesis foot with iliac crest bone graft (2011); Sesamoidectomy (Left, 2013); Shoulder arthroscopy w/ superior labral anterior posterior repair (Right, 2014); Shoulder arthroscopy w/ superior labral anterior posterior lesion repair (Right, 2015); fai repair (Right, 2016); FAI repair (Right, 2018);  and Breast surgery (07/2018).  Family History [name]'s family history is not on file.  Social History Patient  reports that she has never smoked. She has never used smokeless tobacco. She reports current alcohol use. She reports that she does not use drugs.    Objective: Vitals:   09/05/18 1429  BP: 118/78  Pulse: 74  Temp: 98.4 F (36.9 C)  TempSrc: Oral  SpO2: 98%  Weight: 131 lb 6.4 oz (59.6 kg)  Height: 5\' 4"  (1.626 m)    Body mass index is 22.55 kg/m.  Physical Exam Vitals signs reviewed.  Constitutional:      Appearance: Normal appearance.  Neck:     Musculoskeletal: Normal range of motion and neck supple.  Cardiovascular:     Rate and Rhythm: Normal rate and regular rhythm.     Heart sounds: Normal heart sounds.  Pulmonary:     Effort: Pulmonary effort is normal.     Breath sounds: Normal breath sounds.  Abdominal:     General: Abdomen is flat. Bowel sounds are normal.     Palpations: Abdomen is soft.  Skin:    General: Skin is warm and dry.     Capillary Refill: Capillary refill takes less than 2 seconds.  Neurological:     General: No focal deficit present.     Mental Status: She is alert and oriented to person, place, and time.         GAD 7 : Generalized Anxiety Score 09/05/2018 03/08/2018  Nervous, Anxious, on Edge 3 1  Control/stop worrying 3 0  Worry  too much - different things 1 0  Trouble relaxing 3 0  Restless 1 0  Easily annoyed or irritable 1 0  Afraid - awful might happen 1 0  Total GAD 7 Score 13 1  Anxiety Difficulty Somewhat difficult Not difficult at all     Office Visit from 09/05/2018 in [facility]  PHQ-9 Total Score  12       Assessment/plan: 1. Generalized anxiety disorder We are going to increase her buspar to 15mg  BID and add on hydroxyzine prn. This is situational secondary to school situation so she would like to hold off on adding a SSRI back. Discussed if still not managing she will need to have  this done. Discussed Celexa or effexor if need to add in future. She can email me if we need to add on more medication. She is looking for another job as well that will help with anxiety. Did discuss counseling, but she doesn't have time to do this at this point.     Return if symptoms worsen or fail to improve.   Orland MustardAllison Montray Kliebert, MD [facility]   09/05/2018

## 2018-09-27 ENCOUNTER — Other Ambulatory Visit: Payer: Self-pay | Admitting: Family Medicine

## 2018-10-12 ENCOUNTER — Ambulatory Visit: Payer: BC Managed Care – PPO | Admitting: Family Medicine

## 2018-10-17 ENCOUNTER — Ambulatory Visit: Payer: BC Managed Care – PPO | Admitting: Family Medicine

## 2018-10-28 ENCOUNTER — Telehealth: Payer: Self-pay

## 2018-10-28 NOTE — Telephone Encounter (Signed)
Left detailed voicemail message on patient's cell phone in regards to upcoming appt w/Dr. Artis Flock on 3/30.  Requested a call back from pt.

## 2018-10-31 ENCOUNTER — Encounter: Payer: Self-pay | Admitting: Family Medicine

## 2018-10-31 ENCOUNTER — Ambulatory Visit (INDEPENDENT_AMBULATORY_CARE_PROVIDER_SITE_OTHER): Payer: BC Managed Care – PPO | Admitting: Family Medicine

## 2018-10-31 DIAGNOSIS — G43019 Migraine without aura, intractable, without status migrainosus: Secondary | ICD-10-CM | POA: Diagnosis not present

## 2018-10-31 MED ORDER — TOPIRAMATE 25 MG PO TABS: 25.0000 mg | ORAL_TABLET | Freq: Every day | ORAL | 1 refills | Status: DC

## 2018-10-31 MED ORDER — PROMETHAZINE HCL 12.5 MG PO TABS: 12.5000 mg | ORAL_TABLET | Freq: Three times a day (TID) | ORAL | 0 refills | Status: DC | PRN

## 2018-10-31 NOTE — Progress Notes (Signed)
Patient: Sheri Wilson MRN: [id number] DOB: [birth year] PCP: Orland Mustard, MD     I connected with [name] on 10/31/18 at 2:11pm by a video enabled telemedicine application and verified that I am speaking with the correct person using two identifiers.  Location patient: Home Location provider: [facility], Office Persons participating in this virtual visit: Kaydan Mancias, Vanessa Kick (boyfriend) and Dr. Artis Flock   I discussed the limitations of evaluation and management by telemedicine and the availability of in person appointments. The patient expressed understanding and agreed to proceed.   Subjective:  Chief Complaint  Patient presents with  . Migraine    HPI: The patient is a 23 y.o. female who presents today for migraines. She was seen at an urgent care in February of this year. She went because she had had an intractable migraine x 3 days. She was given toradol and a phenergan shot. She doesn't have a migraine today. She currently uses abortive therapy with relpax and that has not been working. She was out of relpax and couldn't get it filled so she went to urgent care. She states her migraines are getting more frequent and are about 2+/weekly. She feels like they are getting worse. She can not identify any triggers and has kept a food journal. She is working from home now and working more, but anxiety is improved without having to go into the classroom. Her migraine is typical behind both eyes. She has both both photophobia and phonophobia as well as N/V. They have not been the worst headache of her life and she denies a thunderclap headache, focal deficits or waking her up. She is also interested in seeing the headache clinic at [facility].   Of note her gyn put her on combined OCP and she is on buspar which can contribute to her migraines.   Review of Systems  Constitutional: Negative for fatigue.  HENT: Negative.   Eyes: Negative for photophobia and visual disturbance.   Respiratory: Negative for cough and shortness of breath.   Cardiovascular: Negative for chest pain.  Gastrointestinal: Negative for abdominal pain and nausea.  Musculoskeletal: Negative for back pain and neck pain.  Neurological: Negative for dizziness and headaches.    Allergies Patient is allergic to indomethacin and sulfa antibiotics.  Past Medical History Patient  has a past medical history of Asthma and Migraines.  Surgical History Patient  has a past surgical history that includes Arthrodesis foot with iliac crest bone graft (2011); Sesamoidectomy (Left, 2013); Shoulder arthroscopy w/ superior labral anterior posterior repair (Right, 2014); Shoulder arthroscopy w/ superior labral anterior posterior lesion repair (Right, 2015); fai repair (Right, 2016); FAI repair (Right, 2018); and Breast surgery (07/2018).  Family History [name]'s family history is not on file.  Social History Patient  reports that she has never smoked. She has never used smokeless tobacco. She reports current alcohol use. She reports that she does not use drugs.    Objective: There were no vitals filed for this visit.  There is no height or weight on file to calculate BMI.    Assessment/plan: 1. Intractable migraine without aura and without status migrainosus Will add on topamax daily as preventative. Starting her at 25mg  and can titrate up to 50mg  daily. Continue relpax for abortive therapy and then phenergan prn. Discussed side effects of topamax and that she can not get pregnant on this. Also am going to have her decrease buspar dosage down since she is not at school and anxiety better and see if this  helps her headaches. On low loestrin, so hormone dosage is quite low, but another thing we could stop as well if she wanted. Also recommending 500mg  of magnesium daily. Continue fluids/exercise. Will do referral to headache clinic. She would be good candidate for new injectables for migraines. F/u in 3 months  with me or headache clinic or let me know if any issues.  - Ambulatory referral to Neurology    Return in about 3 months (around 01/31/2019).    Orland Mustard, MD [facility]  10/31/2018

## 2018-11-01 ENCOUNTER — Telehealth: Payer: Self-pay | Admitting: Family Medicine

## 2018-11-01 NOTE — Telephone Encounter (Signed)
Copied from CRM [id number]. Topic: General - Other >> Nov 01, 2018  1:59 PM Floria Raveling A wrote: Reason for CRM:   [facility] on general [facility] there is a interaction between the pt Birth control and the Topamax.  They would like a call back to verify this is ok.    [id number] [place], [place] - [address] [phone] (Phone)

## 2018-11-02 NOTE — Telephone Encounter (Signed)
Spoke to [facility] (pharmacist at [facility]) and verified per Dr. Artis Flock that topamax is fine to take w/ocp's.  Pt was prescribed a very low dose of Topamax.

## 2018-11-22 ENCOUNTER — Encounter: Payer: Self-pay | Admitting: Family Medicine

## 2018-11-22 ENCOUNTER — Other Ambulatory Visit: Payer: Self-pay

## 2018-11-22 ENCOUNTER — Ambulatory Visit (INDEPENDENT_AMBULATORY_CARE_PROVIDER_SITE_OTHER): Payer: BC Managed Care – PPO | Admitting: Family Medicine

## 2018-11-22 DIAGNOSIS — R197 Diarrhea, unspecified: Secondary | ICD-10-CM

## 2018-11-22 NOTE — Patient Instructions (Signed)
Please return in 3-4 weeks in the office if your symptoms persist.  If you have any questions or concerns, please don't hesitate to send me a message via MyChart or call the office at [phone]. Thank you for visiting with [place] today! It's our pleasure caring for you.   Diarrhea, Adult Diarrhea is frequent loose and watery bowel movements. Diarrhea can make you feel weak and cause you to become dehydrated. Dehydration can make you tired and thirsty, cause you to have a dry mouth, and decrease how often you urinate. Diarrhea typically lasts 2-3 days. However, it can last longer if it is a sign of something more serious. It is important to treat your diarrhea as told by your health care provider. Follow these instructions at home: Eating and drinking     Follow these recommendations as told by your health care provider:  Take an oral rehydration solution (ORS). This is an over-the-counter medicine that helps return your body to its normal balance of nutrients and water. It is found at pharmacies and retail stores.  Drink plenty of fluids, such as water, ice chips, diluted fruit juice, and low-calorie sports drinks. You can drink milk also, if desired.  Avoid drinking fluids that contain a lot of sugar or caffeine, such as energy drinks, sports drinks, and soda.  Eat bland, easy-to-digest foods in small amounts as you are able. These foods include bananas, applesauce, rice, lean meats, toast, and crackers.  Avoid alcohol.  Avoid spicy or fatty foods.  Medicines  Take over-the-counter and prescription medicines only as told by your health care provider.  If you were prescribed an antibiotic medicine, take it as told by your health care provider. Do not stop using the antibiotic even if you start to feel better. General instructions   Wash your hands often using soap and water. If soap and water are not available, use a hand sanitizer. Others in the household should wash their hands as  well. Hands should be washed: ? After using the toilet or changing a diaper. ? Before preparing, cooking, or serving food. ? While caring for a sick person or while visiting someone in a hospital.  Drink enough fluid to keep your urine pale yellow.  Rest at home while you recover.  Watch your condition for any changes.  Take a warm bath to relieve any burning or pain from frequent diarrhea episodes.  Keep all follow-up visits as told by your health care provider. This is important. Contact a health care provider if:  You have a fever.  Your diarrhea gets worse.  You have new symptoms.  You cannot keep fluids down.  You feel light-headed or dizzy.  You have a headache.  You have muscle cramps. Get help right away if:  You have chest pain.  You feel extremely weak or you faint.  You have bloody or black stools or stools that look like tar.  You have severe pain, cramping, or bloating in your abdomen.  You have trouble breathing or you are breathing very quickly.  Your heart is beating very quickly.  Your skin feels cold and clammy.  You feel confused.  You have signs of dehydration, such as: ? Dark urine, very little urine, or no urine. ? Cracked lips. ? Dry mouth. ? Sunken eyes. ? Sleepiness. ? Weakness. Summary  Diarrhea is frequent loose and watery bowel movements. Diarrhea can make you feel weak and cause you to become dehydrated.  Drink enough fluids to keep your urine pale  yellow.  Make sure that you wash your hands after using the toilet. If soap and water are not available, use hand sanitizer.  Contact a health care provider if your diarrhea gets worse or you have new symptoms.  Get help right away if you have signs of dehydration. This information is not intended to replace advice given to you by your health care provider. Make sure you discuss any questions you have with your health care provider. Document Released: 07/10/2002 Document  Revised: 12/24/2017 Document Reviewed: 12/24/2017 Elsevier Interactive Patient Education  2019 [employer]

## 2018-11-22 NOTE — Progress Notes (Signed)
Virtual Visit via Video Note  Subjective  CC:  Chief Complaint  Patient presents with  . Upset Stomach    Having some nausea, diarrhea(3-5 times a day for 3 weeks).. She denies vomiting & fever.. She denies taking anything for symptoms    Same day acute visit; PCP not available. New pt to me. Chart reviewed.   I connected with Sheri Batheharlotte Cragg on 11/22/18 at 10:40 AM EDT by a video enabled telemedicine application and verified that I am speaking with the correct person using two identifiers. Location patient: Home Location provider: [facility], [facility] Persons participating in the virtual visit: Sheri Wilson, Willow Oraamille L Andy, MD Rita Oharaiara Simmons, CMA  I discussed the limitations of evaluation and management by telemedicine and the availability of in person appointments. The patient expressed understanding and agreed to proceed. HPI: Sheri BatheCharlotte Shimada is a 23 y.o. female who was contacted today to address the problems listed above in the chief complaint. 67. 22 yo health female recently started on topamax for migraines. Says it is helping. But having multiple loose nonpainful stools/day. Started 3/29 with 2 episodes. Started on topamax about 10 days later; and now with 4-5 watery stools per day. No pain. No blood or mucous. No f/c/s. Otherwise feels fine. Denies h/o IBS and no FH of IBD. No recent travel. No recent antibiotics. Normal appetite and no significant weight loss. 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 Has GAD on meds. Also on OCPs with regular menses. Assessment  1. Diarrhea, unspecified type      Plan   diarrhea:  Possibly related to side effect from topamax. Pt will hold meds for 1-2 weeks to see if helps. May use immodium and probiotic as well as needed. No red flag sxs. Could be an atypical colitis. rec in office f/u if sxs persist for exam and lab work/stool studies. Pt agrees.  I discussed the assessment and treatment plan with the patient. The  patient was provided an opportunity to ask questions and all were answered. The patient agreed with the plan and demonstrated an understanding of the instructions.   The patient was advised to call back or seek an in-person evaluation if the symptoms worsen or if the condition fails to improve as anticipated. Follow up: Return in about 4 weeks (around 12/20/2018) for recheck.  Visit date not found  No orders of the defined types were placed in this encounter.     I reviewed the patients updated PMH, FH, and SocHx.    Patient Active Problem List   Diagnosis Date Noted  . 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 Vitamin D deficiency 05/24/2018  . Migraine headache without aura 03/21/2018  . Generalized anxiety disorder 03/08/2018   Current Meds  Medication Sig  . busPIRone (BUSPAR) 15 MG tablet Take 1 tablet (15 mg total) by mouth 2 (two) times daily.  . Cholecalciferol (VITAMIN D) 50 MCG (2000 UT) CAPS Take by mouth.  . eletriptan (RELPAX) 20 MG tablet Take 1 tablet (20 mg total) by mouth as needed for migraine or headache. May repeat in 2 hours if headache persists or recurs.  . hydrOXYzine (ATARAX/VISTARIL) 25 MG tablet TAKE 1 TABLET BY MOUTH THREE TIMES A DAY AS NEEDED  . Norethindrone-Ethinyl Estradiol-Fe Biphas (LO LOESTRIN FE) 1 MG-10 MCG / 10 MCG tablet TAKE 1 TABLET BY MOUTH EVERY DAY  . promethazine (PHENERGAN) 12.5 MG tablet Take 1 tablet (12.5 mg total) by mouth every 8 (eight) hours as  needed for nausea or vomiting.  . topiramate (TOPAMAX) 25 MG tablet Take 1 tablet (25 mg total) by mouth daily. May increase to 50mg  after 1-2 weeks to prevent migraines    Allergies: Patient is allergic to indomethacin and sulfa antibiotics. Family History: Patient family history is not on file. Social History:  Patient  reports that she has never smoked. She has never used smokeless tobacco. She reports current alcohol use. She reports that she does not use drugs.  Review of Systems:  Constitutional: Negative for fever malaise or anorexia Cardiovascular: negative for chest pain Respiratory: negative for SOB or persistent cough Gastrointestinal: negative for abdominal pain  OBJECTIVE Vitals: LMP 11/09/2018  reports afebrile General: no acute distress , A&Ox3, appears well Psych: calm demeanor  Willow Ora, MD

## 2018-11-24 ENCOUNTER — Other Ambulatory Visit: Payer: Self-pay | Admitting: Family Medicine

## 2018-11-24 MED ORDER — TOPIRAMATE 50 MG PO TABS: 50.0000 mg | ORAL_TABLET | Freq: Every day | ORAL | 3 refills | Status: DC

## 2018-12-20 ENCOUNTER — Encounter: Payer: Self-pay | Admitting: Family Medicine

## 2018-12-21 ENCOUNTER — Telehealth: Payer: Self-pay

## 2018-12-21 NOTE — Telephone Encounter (Signed)
Left detailed voicemail message on patient's cell phone to schedule an in office visit w/Dr. Artis Flock for her persistent diarrhea.  Requested a call back.

## 2018-12-22 ENCOUNTER — Other Ambulatory Visit: Payer: Self-pay

## 2018-12-22 ENCOUNTER — Encounter: Payer: Self-pay | Admitting: Family Medicine

## 2018-12-22 ENCOUNTER — Ambulatory Visit (INDEPENDENT_AMBULATORY_CARE_PROVIDER_SITE_OTHER): Payer: BC Managed Care – PPO | Admitting: Family Medicine

## 2018-12-22 VITALS — BP 102/68 | HR 74 | Temp 99.0°F | Ht 64.0 in | Wt 130.0 lb

## 2018-12-22 DIAGNOSIS — R197 Diarrhea, unspecified: Secondary | ICD-10-CM

## 2018-12-22 NOTE — Patient Instructions (Signed)
-  doing LOTS of tests: stool studies and labs. Make sure no infection in your stool and make sure thyroid not an issue. If all normal will send to GI.   -watch your food and see if anything correlates -can look at fodmop diet -let me know if pain tends to be more in RUQ over next week.

## 2018-12-22 NOTE — Progress Notes (Signed)
Patient: Sheri Wilson MRN: [id number] DOB: [birth year] PCP: Orland Mustard, MD     Subjective:  Chief Complaint  Patient presents with  . Diarrhea    x 2 mos  . Abdominal Pain    HPI: The patient is a 23 y.o. female who presents today for follow up on diarrhea. She saw Dr. Mardelle Matte via telehealth visit on 11/22/2018. Was told to hold topamax x 1-2 and try anti diarrheals. She did do this and states the anti diarrheals made her bloated and constipated (no BM x 2 days). After she stopped them, her stool went back to being watery with cramps. She also stopped the topamax and this didn't help the diarrhea as well.  She has had no blood in her stool. She states a lot of the times her cramping goes away after a BM, but at night she has pretty severe cramps with no BM or even if she uses the BM. She has about 3-5 BM/day. She has no family history of IBS or IBD or celiacs. She has not kept a food journal, but hasn't noticed any certain foods that trigger this. She did try to cut out bread, but it didn't help. No nausea/vomiting associated with this. Cramping is in her lower abdomen. She mainly has a BM in the AM and before noon as been 3x. The cramping resolves after this and then gets worse at night. Nothing has really helped it. She has no other systemic symptoms. Denies any fever/chills/weight loss.   Review of Systems  Constitutional: Negative for chills and fever.  Respiratory: Negative for shortness of breath.   Cardiovascular: Negative for chest pain.  Gastrointestinal: Positive for abdominal pain and diarrhea. Negative for blood in stool, nausea and vomiting.  Musculoskeletal: Negative for back pain and neck pain.  Neurological: Positive for headaches. Negative for dizziness.    Allergies Patient is allergic to indomethacin and sulfa antibiotics.  Past Medical History Patient  has a past medical history of Asthma and Migraines.  Surgical History Patient  has a past surgical history  that includes Arthrodesis foot with iliac crest bone graft (2011); Sesamoidectomy (Left, 2013); Shoulder arthroscopy w/ superior labral anterior posterior repair (Right, 2014); Shoulder arthroscopy w/ superior labral anterior posterior lesion repair (Right, 2015); fai repair (Right, 2016); FAI repair (Right, 2018); and Breast surgery (07/2018).  Family History [name]'s family history is not on file.  Social History Patient  reports that she has never smoked. She has never used smokeless tobacco. She reports current alcohol use. She reports that she does not use drugs.    Objective: Vitals:   12/22/18 1425  BP: 102/68  Pulse: 74  Temp: 99 F (37.2 C)  TempSrc: Oral  SpO2: 99%  Weight: 130 lb (59 kg)  Height: 5\' 4"  (1.626 m)    Body mass index is 22.31 kg/m.  Physical Exam Vitals signs reviewed.  Constitutional:      Appearance: She is well-developed.  HENT:     Mouth/Throat:     Mouth: Mucous membranes are moist.  Cardiovascular:     Rate and Rhythm: Normal rate and regular rhythm.     Heart sounds: Normal heart sounds.  Pulmonary:     Effort: Pulmonary effort is normal.     Breath sounds: Normal breath sounds.  Abdominal:     General: Abdomen is flat. Bowel sounds are normal.     Palpations: Abdomen is soft.     Tenderness: There is abdominal tenderness in the right upper quadrant, right lower  quadrant, left upper quadrant and left lower quadrant. There is no right CVA tenderness, left CVA tenderness, guarding or rebound. Negative signs include Murphy's sign.     Hernia: No hernia is present.  Skin:    General: Skin is warm and dry.     Capillary Refill: Capillary refill takes less than 2 seconds.     Findings: No rash.  Neurological:     General: No focal deficit present.     Mental Status: She is alert.  Psychiatric:        Mood and Affect: Mood normal.        Behavior: Behavior normal.        Assessment/plan: 1. Diarrhea, unspecified type X1+month  history. NO improvement with stopping topamax, probiotics, imodium. Continues to have multiple watery stools/day with cramping abdominal pain. Checking stool studies, tsh, inflammatory markers. Discussed with her differential includes IBS, IBD, celiacs (but hx not very suggestive of this) and would put gallbladder on there as well. Discussed I want her to keep a food journal and gave her FODMOP food information. Make sure drinking water. Will f/u with stool studies/labs. Will likely need GI referral, but will wait to make sure nothing in stool study I can treat before I send her. ER precautions given.  - Gastrointestinal Pathogen Panel PCR; Future - Stool culture; Future - Fecal occult blood, imunochemical; Future - Fecal lactoferrin, quant; Future - Clostridium difficile Toxin B, Qualitative, Real-Time PCR; Future - Ova and parasite examination; Future - Comprehensive metabolic panel - CBC - TSH - Sedimentation rate - C-reactive protein   Return if symptoms worsen or fail to improve.     Orland MustardAllison Declyn Offield, MD [facility]  12/22/2018

## 2018-12-23 ENCOUNTER — Other Ambulatory Visit (INDEPENDENT_AMBULATORY_CARE_PROVIDER_SITE_OTHER): Payer: BC Managed Care – PPO

## 2018-12-23 DIAGNOSIS — R197 Diarrhea, unspecified: Secondary | ICD-10-CM | POA: Diagnosis not present

## 2018-12-23 LAB — COMPREHENSIVE METABOLIC PANEL
ALT: 9 U/L (ref 0–35)
AST: 12 U/L (ref 0–37)
Albumin: 4.7 g/dL (ref 3.5–5.2)
Alkaline Phosphatase: 42 U/L (ref 39–117)
BUN: 12 mg/dL (ref 6–23)
CO2: 22 mEq/L (ref 19–32)
Calcium: 9.5 mg/dL (ref 8.4–10.5)
Chloride: 106 mEq/L (ref 96–112)
Creatinine, Ser: 0.8 mg/dL (ref 0.40–1.20)
GFR: 87.36 mL/min (ref >60.00)
Glucose, Bld: 90 mg/dL (ref 70–99)
Potassium: 3.9 mEq/L (ref 3.5–5.1)
Sodium: 138 mEq/L (ref 135–145)
Total Bilirubin: 0.4 mg/dL (ref 0.2–1.2)
Total Protein: 7 g/dL (ref 6.0–8.3)

## 2018-12-23 LAB — CBC
HCT: 39.9 % (ref 36.0–46.0)
Hemoglobin: 13.8 g/dL (ref 12.0–15.0)
MCHC: 34.7 g/dL (ref 30.0–36.0)
MCV: 84.3 fl (ref 78.0–100.0)
Platelets: 205 10*3/uL (ref 150.0–400.0)
RBC: 4.73 Mil/uL (ref 3.87–5.11)
RDW: 12.2 % (ref 11.5–15.5)
WBC: 5.6 10*3/uL (ref 4.0–10.5)

## 2018-12-23 LAB — TSH: TSH: 0.91 u[IU]/mL (ref 0.35–4.50)

## 2018-12-23 LAB — C-REACTIVE PROTEIN: CRP: <1 mg/dL (ref 0.5–20.0)

## 2018-12-23 LAB — SEDIMENTATION RATE: Sed Rate: 2 mm/hr (ref 0–20)

## 2018-12-28 ENCOUNTER — Other Ambulatory Visit: Payer: Self-pay | Admitting: Family Medicine

## 2018-12-28 DIAGNOSIS — R197 Diarrhea, unspecified: Secondary | ICD-10-CM

## 2018-12-28 LAB — GASTROINTESTINAL PATHOGEN PANEL PCR
C. difficile Tox A/B, PCR: NOT DETECTED
Campylobacter, PCR: NOT DETECTED
Cryptosporidium, PCR: NOT DETECTED
E coli (ETEC) LT/ST PCR: NOT DETECTED
E coli (STEC) stx1/stx2, PCR: NOT DETECTED
E coli 0157, PCR: NOT DETECTED
Giardia lamblia, PCR: NOT DETECTED
Norovirus, PCR: NOT DETECTED
Rotavirus A, PCR: NOT DETECTED
Salmonella, PCR: NOT DETECTED
Shigella, PCR: NOT DETECTED

## 2018-12-28 LAB — STOOL CULTURE
MICRO NUMBER:: 500496
MICRO NUMBER:: 500497
MICRO NUMBER:: 500499
SHIGA RESULT:: NOT DETECTED
SPECIMEN QUALITY:: ADEQUATE
SPECIMEN QUALITY:: ADEQUATE
SPECIMEN QUALITY:: ADEQUATE

## 2018-12-28 LAB — OVA AND PARASITE EXAMINATION
CONCENTRATE RESULT:: NONE SEEN
MICRO NUMBER:: 500671
SPECIMEN QUALITY:: ADEQUATE
TRICHROME RESULT:: NONE SEEN

## 2018-12-28 LAB — FECAL LACTOFERRIN, QUANT
Fecal Lactoferrin: NEGATIVE
MICRO NUMBER:: 500498
SPECIMEN QUALITY:: ADEQUATE

## 2018-12-28 LAB — FECAL OCCULT BLOOD, IMMUNOCHEMICAL: Fecal Occult Bld: NEGATIVE

## 2018-12-28 LAB — CLOSTRIDIUM DIFFICILE TOXIN B, QUALITATIVE, REAL-TIME PCR: Toxigenic C. Difficile by PCR: NOT DETECTED

## 2019-01-04 NOTE — Progress Notes (Signed)
TELEHEALTH VISIT  Referring Provider: Orma Flaming, MD Primary Care Physician:  Orma Flaming, MD   Tele-visit due to COVID-19 pandemic Patient requested visit virtually, consented to the virtual encounter via video enabled telemedicine application (Zoom) Contact made at: 16:02 01/09/19 Patient verified by name and date of birth Location of patient: Home Location provider: [facility] Names of persons participating: Me, patient, Tinnie Gens CMA Time spent on telehealth visit: 28 minutes I discussed the limitations of evaluation and management by telemedicine. The patient expressed understanding and agreed to proceed.  Reason for Consultation: Diarrhea   IMPRESSION:  Diarrrhea    - stool studies negative: GI pathogen panel, C Diff, O&P, lactoferrin, occult blood    - TSH, CRP, and ESR normal  The differential diagnosis of chronic diarrhea without alarm features includes: irritable bowel syndrome, IBD, celiac disease, missed infection (such as giardia), food intolerance,microscopic colitis, thyroid disorder, other functional GI disease. By history, this is less likely to be obstruction.     PLAN: - EGD with duodenal biopsies given the patient's prior medical care - Colonoscopy with random biopsies and evaluation of the terminal ileum  I consented the patient discussing the risks, benefits, and alternatives to endoscopic evaluation. In particular, we discussed the risks that include, but are not limited to, reaction to medication, cardiopulmonary compromise, bleeding requiring blood transfusion, aspiration resulting in pneumonia, perforation requiring surgery, lack of diagnosis, severe illness requiring hospitalization, and even death. We reviewed the risk of missed lesion including polyps or even cancer. The patient acknowledges these risks and asks that we proceed.   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 Eliberto Ivory  for further evaluation of diarrhea.  The history is obtained to the patient and review of her electronic health record.  Diarrhea developed the last week of March. Multiple watery bowel movements daily with associated abdominal cramping. 3-6 BM daily. Sometimes the diarrhea is so bad that she sees blood on the toilet. No rectal pain. No mucous. No improvement with stopping topamax, probiotics, imodium. However, the imodium caused significant abdominal distress.   Recent stool studies were negative for GI pathogen panel, stool culture, occult blood, fecal lactoferrin, C. difficile, and ova and parasites.  CRP, sedimentation rate, and TSH were normal.  Dr. Rogers Blocker discussed keeping a food journal and gave her FODMOP food information. She found that she didn't many of these things at baseline. No identified food triggers with the journal. Already following a whole foods diet. Appetite fluctuates based on her symptoms. Possible weight loss but her doctor told her this could be due to the Topamax.    Over the last week she has noticed diarrhea and constipation with associated bloating. Heavy menses for the last few months.   Denies a precipitating event, trauma, close contacts with similar symptoms, changes in diet, recent travel or antibiotic use. Less energy than normal. No other associated symptoms. No identified exacerbating or relieving features.   No known family history of colon cancer or polyps. No family history of uterine/endometrial cancer, pancreatic cancer or gastric/stomach cancer.  Past Medical History:  Diagnosis Date  . Asthma   . Migraines     Past Surgical History:  Procedure Laterality Date  . ARTHRODESIS FOOT WITH ILIAC CREST BONE GRAFT  2011   bone graft L foot sesamoid bone  . BREAST SURGERY  07/2018   right breast biopsy  . fai repair Right 2016   FAI repair right hip  .  FAI repair Right 2018   FAI repair/labrum repair right hip  . SESAMOIDECTOMY Left 2013  . SHOULDER  ARTHROSCOPY W/ SUPERIOR LABRAL ANTERIOR POSTERIOR LESION REPAIR Right 2015  . SHOULDER ARTHROSCOPY W/ SUPERIOR LABRAL ANTERIOR POSTERIOR REPAIR Right 2014    Current Outpatient Medications  Medication Sig Dispense Refill  . busPIRone (BUSPAR) 15 MG tablet Take 1 tablet (15 mg total) by mouth 2 (two) times daily. 180 tablet 1  . Cholecalciferol (VITAMIN D) 50 MCG (2000 UT) CAPS Take by mouth.    . eletriptan (RELPAX) 20 MG tablet Take 1 tablet (20 mg total) by mouth as needed for migraine or headache. May repeat in 2 hours if headache persists or recurs. 10 tablet 2  . hydrOXYzine (ATARAX/VISTARIL) 25 MG tablet TAKE 1 TABLET BY MOUTH THREE TIMES A DAY AS NEEDED 270 tablet 1  . Norethindrone-Ethinyl Estradiol-Fe Biphas (LO LOESTRIN FE) 1 MG-10 MCG / 10 MCG tablet TAKE 1 TABLET BY MOUTH EVERY DAY    . promethazine (PHENERGAN) 12.5 MG tablet Take 1 tablet (12.5 mg total) by mouth every 8 (eight) hours as needed for nausea or vomiting. 20 tablet 0  . topiramate (TOPAMAX) 50 MG tablet Take 1 tablet (50 mg total) by mouth daily. 90 tablet 3   No current facility-administered medications for this visit.     Allergies as of 01/09/2019 - Review Complete 12/22/2018  Allergen Reaction Noted  . Indomethacin Other (See Comments) 02/07/2018  . Sulfa antibiotics Hives 03/08/2018    Family History  Problem Relation Age of Onset  . Cancer Neg Hx   . Diabetes Neg Hx   . Stroke Neg Hx   . Inflammatory bowel disease Neg Hx     Social History   Socioeconomic History  . Marital status: Single    Spouse name: Not on file  . Number of children: Not on file  . Years of education: Not on file  . Highest education level: Not on file  Occupational History  . Not on file  Social Needs  . Financial resource strain: Not on file  . Food insecurity:    Worry: Not on file    Inability: Not on file  . Transportation needs:    Medical: Not on file    Non-medical: Not on file  Tobacco Use  . Smoking  status: Never Smoker  . Smokeless tobacco: Never Used  Substance and Sexual Activity  . Alcohol use: Yes    Comment: socially  . Drug use: Never  . Sexual activity: Yes  Lifestyle  . Physical activity:    Days per week: Not on file    Minutes per session: Not on file  . Stress: Not on file  Relationships  . Social connections:    Talks on phone: Not on file    Gets together: Not on file    Attends religious service: Not on file    Active member of club or organization: Not on file    Attends meetings of clubs or organizations: Not on file    Relationship status: Not on file  . Intimate partner violence:    Fear of current or ex partner: Not on file    Emotionally abused: Not on file    Physically abused: Not on file    Forced sexual activity: Not on file  Other Topics Concern  . Not on file  Social History Narrative  . Not on file    Review of Systems: ALL ROS discussed and all others negative  except listed in HPI:  Frequent dry, itchy eyes x 2-3 months. She is using eye drops because she thought it might be allergies. Some sense of incomplete micturation over the last couple of weeks. Some hip pain that she has attributed to prior surgeries for FAI in 2017 and 2019. No other extra GI manifestations of IBD.   Physical Exam: General: in no acute distress Neuro: Alert and appropriate Psych: Normal affect and normal insight   Bunnie Rehberg L. Tarri Glenn, MD, MPH [facility] 01/04/2019, 9:17 AM

## 2019-01-09 ENCOUNTER — Ambulatory Visit (INDEPENDENT_AMBULATORY_CARE_PROVIDER_SITE_OTHER): Payer: BC Managed Care – PPO | Admitting: Gastroenterology

## 2019-01-09 ENCOUNTER — Other Ambulatory Visit: Payer: Self-pay

## 2019-01-09 ENCOUNTER — Encounter: Payer: Self-pay | Admitting: Gastroenterology

## 2019-01-09 VITALS — Ht 64.0 in | Wt 128.0 lb

## 2019-01-09 DIAGNOSIS — R197 Diarrhea, unspecified: Secondary | ICD-10-CM

## 2019-01-09 MED ORDER — NA SULFATE-K SULFATE-MG SULF 17.5-3.13-1.6 GM/177ML PO SOLN: 1.0000 | ORAL | 0 refills | Status: DC

## 2019-01-09 NOTE — Patient Instructions (Addendum)
I have recommended a colonoscopy with random biopsies and an upper endoscopy with small bowel biopsies for further evaluation of your diarrhea.   Drink a lot of liquids that have water, salt, and sugar. Good choices are water mixed with juice, flavored soda, and soup broth. If you are drinking enough, your urine will be light yellow or almost clear.  Try to eat a little food. Good choices are potatoes, noodles, rice, oatmeal, crackers, bananas, soup, and boiled vegetables.  Avoid high fat foods, as they can make diarrhea worse.   Dairy products (except yogurt) may be difficult to digest when you have diarrhea. I recommend that you temporarily avoid lactose-containing foods.   Try bismuth salicylate (Pepto-Bismol) 30 mL or two tablets every 30 minutes for eight doses for your symptoms.  Thank you for your patience with me and our technology today!I look forward to meeting you in person in the future.

## 2019-02-13 ENCOUNTER — Telehealth: Payer: Self-pay | Admitting: Gastroenterology

## 2019-02-13 NOTE — Telephone Encounter (Signed)

## 2019-02-14 ENCOUNTER — Encounter: Payer: Self-pay | Admitting: Gastroenterology

## 2019-02-14 ENCOUNTER — Ambulatory Visit (AMBULATORY_SURGERY_CENTER): Payer: BC Managed Care – PPO | Admitting: Gastroenterology

## 2019-02-14 ENCOUNTER — Other Ambulatory Visit: Payer: Self-pay

## 2019-02-14 VITALS — BP 103/63 | HR 69 | Temp 98.6°F | Resp 13 | Ht 64.0 in | Wt 130.0 lb

## 2019-02-14 DIAGNOSIS — R197 Diarrhea, unspecified: Secondary | ICD-10-CM

## 2019-02-14 MED ORDER — SODIUM CHLORIDE 0.9 % IV SOLN: 500.0000 mL | Freq: Once | INTRAVENOUS | Status: DC

## 2019-02-14 NOTE — Progress Notes (Signed)
Pt's states no medical or surgical changes since previsit or office visit. 

## 2019-02-14 NOTE — Progress Notes (Signed)
PT taken to PACU. Monitors in place. VSS. Report given to RN. 

## 2019-02-14 NOTE — Op Note (Addendum)
[facility] Patient Name: Sheri BatheCharlotte Wilson Procedure Date: 02/14/2019 7:35 AM MRN: [id number] Endoscopist: Tressia DanasKimberly Theordore Cisnero MD, MD Age: [age] Referring MD:  Date of Birth: 02/28/1996 Gender: Female Account #: [id number] Procedure:                Colonoscopy Indications:              Clinically significant diarrhea of unexplained                            origin Medicines:                See the Anesthesia note for documentation of the                            administered medications Procedure:                Pre-Anesthesia Assessment:                           - Prior to the procedure, a History and Physical                            was performed, and patient medications and                            allergies were reviewed. The patient's tolerance of                            previous anesthesia was also reviewed. The risks                            and benefits of the procedure and the sedation                            options and risks were discussed with the patient.                            All questions were answered, and informed consent                            was obtained. Prior Anticoagulants: The patient has                            taken no previous anticoagulant or antiplatelet                            agents. ASA Grade Assessment: I - A normal, healthy                            patient. After reviewing the risks and benefits,                            the patient was deemed in satisfactory condition to  undergo the procedure.                           After obtaining informed consent, the colonoscope                            was passed under direct vision. Throughout the                            procedure, the patient's blood pressure, pulse, and                            oxygen saturations were monitored continuously. The                            Colonoscope was introduced through the anus and               advanced to the the terminal ileum, with                            identification of the appendiceal orifice and IC                            valve. A second forward view of the right colon was                            performed. The colonoscopy was performed without                            difficulty. The patient tolerated the procedure                            well. The quality of the bowel preparation was                            excellent. The terminal ileum, ileocecal valve,                            appendiceal orifice, and rectum were photographed. Scope In: 7:48:16 AM Scope Out: 8:01:11 AM Scope Withdrawal Time: 0 hours 8 minutes 37 seconds  Total Procedure Duration: 0 hours 12 minutes 55 seconds  Findings:                 The perianal and digital rectal examinations were                            normal.                           The colon (entire examined portion) appeared                            normal. Biopsies for histology were taken with a  cold forceps from the entire colon for evaluation                            of microscopic colitis. Estimated blood loss was                            minimal.                           A patchy area of mucosa in the terminal ileum was                            mildly erythematous. Biopsies were taken with a                            cold forceps for histology. Estimated blood loss                            was minimal.                           The exam was otherwise without abnormality on                            direct and retroflexion views. Complications:            No immediate complications. Estimated blood loss:                            Minimal. Estimated Blood Loss:     Estimated blood loss was minimal. Impression:               - The entire examined colon is normal. Biopsied.                           - Erythematous mucosa in the terminal ileum. This                             is very mild and of unclear clinical significance.                            Biopsied.                           - The examination was otherwise normal on direct                            and retroflexion views.                           - No obvious source for diarrhea identified on this                            colonoscopy. Awaiting biopsy results. Recommendation:           - Patient has a contact number available for  emergencies. The signs and symptoms of potential                            delayed complications were discussed with the                            patient. Return to normal activities tomorrow.                            Written discharge instructions were provided to the                            patient.                           - Resume regular diet today.                           - Continue present medications.                           - Await pathology results.                           - Repeat colonoscopy at age 81 for screening                            purposes.                           - Follow-up encounter to review these results. Thornton Park MD, MD 02/14/2019 8:13:56 AM This report has been signed electronically.

## 2019-02-14 NOTE — Patient Instructions (Signed)
Thank you for allowing [place] to care for you today!  Await pathology results by mail, approximately 2 weeks.  Resume previous diet and medications today.  Return to normal activities tomorrow.    YOU HAD AN ENDOSCOPIC PROCEDURE TODAY AT [facility]:   Refer to the procedure report that was given to you for any specific questions about what was found during the examination.  If the procedure report does not answer your questions, please call your gastroenterologist to clarify.  If you requested that your care partner not be given the details of your procedure findings, then the procedure report has been included in a sealed envelope for you to review at your convenience later.  YOU SHOULD EXPECT: Some feelings of bloating in the abdomen. Passage of more gas than usual.  Walking can help get rid of the air that was put into your GI tract during the procedure and reduce the bloating. If you had a lower endoscopy (such as a colonoscopy or flexible sigmoidoscopy) you may notice spotting of blood in your stool or on the toilet paper. If you underwent a bowel prep for your procedure, you may not have a normal bowel movement for a few days.  Please Note:  You might notice some irritation and congestion in your nose or some drainage.  This is from the oxygen used during your procedure.  There is no need for concern and it should clear up in a day or so.  SYMPTOMS TO REPORT IMMEDIATELY:   Following lower endoscopy (colonoscopy or flexible sigmoidoscopy):  Excessive amounts of blood in the stool  Significant tenderness or worsening of abdominal pains  Swelling of the abdomen that is new, acute  Fever of 100F or higher   Following upper endoscopy (EGD)  Vomiting of blood or coffee ground material  New chest pain or pain under the shoulder blades  Painful or persistently difficult swallowing  New shortness of breath  Fever of 100F or higher  Black, tarry-looking stools  For  urgent or emergent issues, a gastroenterologist can be reached at any hour by calling [phone].   DIET:  We do recommend a small meal at first, but then you may proceed to your regular diet.  Drink plenty of fluids but you should avoid alcoholic beverages for 24 hours.  ACTIVITY:  You should plan to take it easy for the rest of today and you should NOT DRIVE or use heavy machinery until tomorrow (because of the sedation medicines used during the test).    FOLLOW UP: Our staff will call the number listed on your records 48-72 hours following your procedure to check on you and address any questions or concerns that you may have regarding the information given to you following your procedure. If we do not reach you, we will leave a message.  We will attempt to reach you two times.  During this call, we will ask if you have developed any symptoms of COVID 19. If you develop any symptoms (ie: fever, flu-like symptoms, shortness of breath, cough etc.) before then, please call [phone].  If you test positive for Covid 19 in the 2 weeks post procedure, please call and report this information to [place].    If any biopsies were taken you will be contacted by phone or by letter within the next 1-3 weeks.  Please call us at [phone] if you have not heard about the biopsies in 3 weeks.    SIGNATURES/CONFIDENTIALITY: You and/or your care  partner have signed paperwork which will be entered into your electronic medical record.  These signatures attest to the fact that that the information above on your After Visit Summary has been reviewed and is understood.  Full responsibility of the confidentiality of this discharge information lies with you and/or your care-partner.

## 2019-02-14 NOTE — Op Note (Signed)
[facility] Patient Name: Sheri Wilson Procedure Date: 02/14/2019 7:36 AM MRN: [id number] Endoscopist: Thornton Park MD, MD Age: 23 Referring MD:  Date of Birth: February 09, 1996 Gender: Female Account #: [id number] Procedure:                Upper GI endoscopy Indications:              Diarrhea Medicines:                See the Anesthesia note for documentation of the                            administered medications Procedure:                Pre-Anesthesia Assessment:                           - Prior to the procedure, a History and Physical                            was performed, and patient medications and                            allergies were reviewed. The patient's tolerance of                            previous anesthesia was also reviewed. The risks                            and benefits of the procedure and the sedation                            options and risks were discussed with the patient.                            All questions were answered, and informed consent                            was obtained. Prior Anticoagulants: The patient has                            taken no previous anticoagulant or antiplatelet                            agents. ASA Grade Assessment: I - A normal, healthy                            patient. After reviewing the risks and benefits,                            the patient was deemed in satisfactory condition to                            undergo the procedure.  After obtaining informed consent, the endoscope was                            passed under direct vision. Throughout the                            procedure, the patient's blood pressure, pulse, and                            oxygen saturations were monitored continuously. The                            Endoscope was introduced through the mouth, and                            advanced to the third part of duodenum. The upper                         GI endoscopy was accomplished without difficulty.                            The patient tolerated the procedure well. Scope In: Scope Out: Findings:                 The examined esophagus was normal.                           The entire examined stomach was normal except for                            an area of focal erythema. Biopsies were taken with                            a cold forceps for histology. Estimated blood loss                            was minimal.                           The examined duodenum was normal. Biopsies were                            taken with a cold forceps for histology.                           The cardia and gastric fundus were normal on                            retroflexion.                           The exam was otherwise without abnormality. Complications:            No immediate complications. Estimated blood loss:  Minimal. Estimated Blood Loss:     Estimated blood loss was minimal. Impression:               - Normal esophagus.                           - Normal stomach except for an area of focal                            erythema. Biopsied.                           - Normal examined duodenum. Biopsied.                           - The examination was otherwise normal. Recommendation:           - Patient has a contact number available for                            emergencies. The signs and symptoms of potential                            delayed complications were discussed with the                            patient. Return to normal activities tomorrow.                            Written discharge instructions were provided to the                            patient.                           - Resume regular diet today.                           - Continue present medications.                           - Await pathology results.                           - Proceed with colonoscopy  today as previousloy                            planned. Tressia DanasKimberly Lizzete Gough MD, MD 02/14/2019 8:08:57 AM This report has been signed electronically.

## 2019-02-14 NOTE — Progress Notes (Signed)
Called to room to assist during endoscopic procedure.  Patient ID and intended procedure confirmed with present staff. Received instructions for my participation in the procedure from the performing physician.  

## 2019-02-15 ENCOUNTER — Encounter: Payer: Self-pay | Admitting: *Deleted

## 2019-02-16 ENCOUNTER — Telehealth: Payer: Self-pay | Admitting: *Deleted

## 2019-02-16 ENCOUNTER — Telehealth: Payer: Self-pay

## 2019-02-16 NOTE — Telephone Encounter (Signed)
  Follow up Call-  Call back number 02/14/2019  Post procedure Call Back phone  # [phone]  Permission to leave phone message Yes     Patient questions:  Do you have a fever, pain , or abdominal swelling? No. Pain Score  0 *  Have you tolerated food without any problems? Yes.    Have you been able to return to your normal activities? Yes.    Do you have any questions about your discharge instructions: Diet   No. Medications  No. Follow up visit  No.  Do you have questions or concerns about your Care? No.  Actions: * If pain score is 4 or above: No action needed, pain <4.  1. Have you developed a fever since your procedure? no  2.   Have you had an respiratory symptoms (SOB or cough) since your procedure? no  3.   Have you tested positive for COVID 19 since your procedure no  4.   Have you had any family members/close contacts diagnosed with the COVID 19 since your procedure? no   If yes to any of these questions please route to Joylene John, RN and Alphonsa Gin, Therapist, sports.

## 2019-02-16 NOTE — Telephone Encounter (Signed)
Left message on follow up call. 

## 2019-02-20 ENCOUNTER — Encounter: Payer: Self-pay | Admitting: *Deleted

## 2019-02-20 ENCOUNTER — Telehealth: Payer: Self-pay | Admitting: *Deleted

## 2019-02-20 ENCOUNTER — Other Ambulatory Visit: Payer: Self-pay | Admitting: *Deleted

## 2019-02-20 NOTE — Telephone Encounter (Signed)
Entered in error

## 2019-03-03 ENCOUNTER — Other Ambulatory Visit: Payer: Self-pay | Admitting: Family Medicine

## 2019-03-07 NOTE — Progress Notes (Signed)
Referring Provider: Orma Flaming, MD Primary Care Physician:  Orma Flaming, MD   Chief complaint: Diarrhea   IMPRESSION:  Chronic diarrrhea    - stool studies negative: GI pathogen panel, C Diff, O&P, lactoferrin, occult blood    - TSH, CRP, and ESR normal    - Normal duodenal, colon, and terminal ileal biopsies 02/14/2019    - intolerant to Imodium    - no improvement with probiotics    - unable to try cholestyramine given interactions with her OCP    - no change on low FODMAP diet No known family history of colon cancer or polyps  Fecal calprotectin, ESR, and CRP were normal. Giardia testing was negative.  EGD with duodenal biopsies was normal. Colonoscopy with random biopsies was normal.  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 Must also consider food allergy testing.     PLAN: Referral to Dr. 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  The interval history is obtained to the patient and review of her electronic health record.  She had an upper endoscopy and colonoscopy performed 02/14/2019.  The upper endoscopy showed mild gastropathy.  Gastric and duodenal biopsies were normal.  The colonoscopy was normal except for a patchy area of erythematous mucosa in the terminal ileum.  However, biopsies of the small bowel were normal.  Random colon biopsies were also normal.  No improvement with stopping topamax, probiotics, imodium. However, the imodium caused significant abdominal distress.  I recommended a trial of cholestyramine but this was not ultimately filled given the potential interactions with oral contraceptives.  So, I recommended Pepto-Bismol.  A trial of a low  FODMAP diet provided no relief.   Continues to have 3 watery BM daily.  At the most she has had 3-6 bowel movements daily.  Continues to use Peptobismal 2-3 times daily to control her.  Her stools are black while using Pepto-Bismol.  Stools are more solid but not normal.  Did not tolerate Imodium due to bloating. Sees undigested food. Some postprandial defecation. Cramping awoke her from sleep 3 times this week.  No identified food triggers. No other associated symptoms. No identified exacerbating or relieving features.   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   Past Medical History:  Diagnosis Date  . Asthma   . Migraines     Past Surgical History:  Procedure Laterality Date  . ARTHRODESIS FOOT WITH ILIAC CREST BONE GRAFT  2011   bone graft L foot sesamoid bone  . BREAST SURGERY  07/2018   right breast biopsy  . fai repair Right 2016   FAI repair right hip  . FAI repair Right 2018   FAI repair/labrum repair right hip  . SESAMOIDECTOMY Left 2013  . SHOULDER ARTHROSCOPY W/ SUPERIOR LABRAL ANTERIOR POSTERIOR LESION REPAIR Right 2015  . SHOULDER ARTHROSCOPY W/ SUPERIOR LABRAL ANTERIOR POSTERIOR REPAIR Right 2014    Current Outpatient Medications  Medication Sig Dispense Refill  . Cholecalciferol (VITAMIN D) 50 MCG (2000 UT) CAPS Take by mouth.    . eletriptan (RELPAX) 20 MG tablet Take 1 tablet (20 mg total) by mouth as needed for migraine or  headache. May repeat in 2 hours if headache persists or recurs. 10 tablet 2  . Norethindrone-Ethinyl Estradiol-Fe Biphas (LO LOESTRIN FE) 1 MG-10 MCG / 10 MCG tablet TAKE 1 TABLET BY MOUTH EVERY DAY    . promethazine (PHENERGAN) 12.5 MG tablet Take 1 tablet (12.5 mg total) by mouth every 8 (eight) hours as needed for nausea or vomiting. 20 tablet 0  . topiramate (TOPAMAX) 50 MG tablet Take 1 tablet (50 mg total) by mouth daily. 90 tablet 3   No current facility-administered medications for this visit.      Allergies as of 03/08/2019 - Review Complete 02/14/2019  Allergen Reaction Noted  . Indomethacin Other (See Comments) 02/07/2018  . Sulfa antibiotics Hives 03/08/2018    Family History  Problem Relation Age of Onset  . Colon cancer Paternal Grandfather   . Cancer Neg Hx   . Diabetes Neg Hx   . Stroke Neg Hx   . Inflammatory bowel disease Neg Hx     Social History   Socioeconomic History  . Marital status: Single    Spouse name: Not on file  . Number of children: Not on file  . Years of education: Not on file  . Highest education level: Not on file  Occupational History  . Not on file  Social Needs  . Financial resource strain: Not on file  . Food insecurity    Worry: Not on file    Inability: Not on file  . Transportation needs    Medical: Not on file    Non-medical: Not on file  Tobacco Use  . Smoking status: Never Smoker  . Smokeless tobacco: Never Used  Substance and Sexual Activity  . Alcohol use: Yes    Comment: socially  . Drug use: Never  . Sexual activity: Yes  Lifestyle  . Physical activity    Days per week: Not on file    Minutes per session: Not on file  . Stress: Not on file  Relationships  . Social Herbalist on phone: Not on file    Gets together: Not on file    Attends religious service: Not on file    Active member of club or organization: Not on file    Attends meetings of clubs or organizations: Not on file    Relationship status: Not on file  . Intimate partner violence    Fear of current or ex partner: Not on file    Emotionally abused: Not on file    Physically abused: Not on file    Forced sexual activity: Not on file  Other Topics Concern  . Not on file  Social History Narrative  . Not on file    Review of Systems: ALL ROS discussed and all others negative except listed in HPI:  Frequent dry, itchy eyes x 2-3 months. She is using eye drops because she thought it might be allergies. Some sense of incomplete  micturation over the last couple of weeks. Some hip pain that she has attributed to prior surgeries for FAI in 2017 and 2019. No other extra GI manifestations of IBD.   Physical Exam: General: in no acute distress Neuro: Alert and appropriate Psych: Normal affect and normal insight   Sindy Mccune L. Tarri Glenn, MD, MPH [facility] 03/07/2019, 9:28 PM

## 2019-03-08 ENCOUNTER — Encounter: Payer: Self-pay | Admitting: Gastroenterology

## 2019-03-08 ENCOUNTER — Ambulatory Visit (INDEPENDENT_AMBULATORY_CARE_PROVIDER_SITE_OTHER): Payer: BC Managed Care – PPO | Admitting: Gastroenterology

## 2019-03-08 VITALS — BP 110/70 | HR 72 | Temp 98.6°F | Ht 64.0 in | Wt 134.4 lb

## 2019-03-08 DIAGNOSIS — R197 Diarrhea, unspecified: Secondary | ICD-10-CM

## 2019-03-08 MED ORDER — ALOSETRON HCL 0.5 MG PO TABS: 0.5000 mg | ORAL_TABLET | Freq: Two times a day (BID) | ORAL | 3 refills | Status: DC

## 2019-03-08 MED ORDER — DICYCLOMINE HCL 20 MG PO TABS: 20.0000 mg | ORAL_TABLET | Freq: Three times a day (TID) | ORAL | 3 refills | Status: DC

## 2019-03-08 NOTE — Patient Instructions (Signed)
I have recommended a referral for food allergy testing.  Let's try dicyclomine 20 mg four times daily as needed for abdominal cramping.  I would like for you to try Lotronex. Start with 0.5 mg twice daily for 4 weeks. If things aren't improving, increase to 1.0 mg twice daily for 4 additional weeks. Please call me at any time if your symptoms are worsening.   Let's plan to see each other in 6-8 weeks to monitor your response to therapy.

## 2019-04-12 ENCOUNTER — Encounter: Payer: Self-pay | Admitting: Family Medicine

## 2019-04-12 ENCOUNTER — Ambulatory Visit (INDEPENDENT_AMBULATORY_CARE_PROVIDER_SITE_OTHER): Payer: BC Managed Care – PPO | Admitting: Family Medicine

## 2019-04-12 VITALS — Temp 97.9°F | Ht 64.0 in | Wt 131.0 lb

## 2019-04-12 DIAGNOSIS — G43009 Migraine without aura, not intractable, without status migrainosus: Secondary | ICD-10-CM | POA: Diagnosis not present

## 2019-04-12 MED ORDER — ELETRIPTAN HYDROBROMIDE 20 MG PO TABS: 20.0000 mg | ORAL_TABLET | ORAL | 2 refills | Status: DC | PRN

## 2019-04-12 MED ORDER — AIMOVIG 70 MG/ML ~~LOC~~ SOAJ: 70.0000 mg | SUBCUTANEOUS | 11 refills | Status: DC

## 2019-04-12 NOTE — Progress Notes (Signed)
Patient: Sheri Wilson MRN: [id number] DOB: 04-26-1996 PCP: Orma Flaming, MD     Subjective:  Chief Complaint  Patient presents with  . migraines    follow up-still continues to have headaches    HPI: The patient is a 23 y.o. female who presents today for migraines. I last saw her in march and we started her on topamax as preventative therapy. We decreased her buspar down as well since this could contribute to headaches and continued her relpax. She is completley off buspar. She was not a fan of topamax. It made food taste bad and made her hand and foot tingle. She couldn't think of words. She took it for 3.5 months and it wasn't helping her migraines at all. She is still on the relpax for abortive therapy. She is getting headaches/migraines about 4 days a week. Headaches last from 1-1.5 hours or can last all night if she doesn't take anything. She does get light sensitivity and nausea with these. She is def. Interested in injectables. Does not get aura's.   Review of Systems  Constitutional: Negative for chills and fever.  HENT: Negative for congestion, postnasal drip, rhinorrhea and sore throat.   Respiratory: Negative for cough, chest tightness and shortness of breath.   Cardiovascular: Negative for chest pain, palpitations and leg swelling.  Gastrointestinal: Negative for abdominal pain, diarrhea, nausea and vomiting.  Musculoskeletal: Negative for back pain and neck pain.  Neurological: Positive for headaches. Negative for dizziness, facial asymmetry and light-headedness.  Psychiatric/Behavioral: Negative for sleep disturbance.    Allergies Patient is allergic to indomethacin and sulfa antibiotics.  Past Medical History Patient  has a past medical history of Asthma and Migraines.  Surgical History Patient  has a past surgical history that includes Arthrodesis foot with iliac crest bone graft (2011); Sesamoidectomy (Left, 2013); Shoulder arthroscopy w/ superior labral  anterior posterior repair (Right, 2014); Shoulder arthroscopy w/ superior labral anterior posterior lesion repair (Right, 2015); fai repair (Right, 2016); FAI repair (Right, 2018); and Breast surgery (07/2018).  Family History [name]'s family history includes Colon cancer in her paternal grandfather.  Social History Patient  reports that she has never smoked. She has never used smokeless tobacco. She reports current alcohol use. She reports that she does not use drugs.    Objective: Vitals:   04/12/19 1059  Temp: 97.9 F (36.6 C)  TempSrc: Skin  Weight: 131 lb (59.4 kg)  Height: 5\' 4"  (1.626 m)    Body mass index is 22.49 kg/m.  Physical Exam Vitals signs reviewed.  Constitutional:      Appearance: Normal appearance.  HENT:     Head: Normocephalic and atraumatic.  Pulmonary:     Effort: Pulmonary effort is normal.  Neurological:     General: No focal deficit present.     Mental Status: She is alert and oriented to person, place, and time.        Assessment/plan: 1. Migraine without aura and without status migrainosus, not intractable Failed topamax and getting 4 migraines a month. Good candidate for injectable. Discussed can not get pregnant on this drug. Will do trial of aimovig and see how she does. Will likely need PA. F/u in 3 months after injectable starts. Monitor BP. Advised she go online for access card from [facility] as well. If approved, will come into office for [place] to give injection.    Return in about 3 months (around 07/12/2019) for h/a new med .   Orma Flaming, MD [place]  04/12/2019  

## 2019-04-21 ENCOUNTER — Other Ambulatory Visit: Payer: Self-pay

## 2019-04-21 ENCOUNTER — Encounter: Payer: Self-pay | Admitting: Family Medicine

## 2019-04-21 ENCOUNTER — Ambulatory Visit (INDEPENDENT_AMBULATORY_CARE_PROVIDER_SITE_OTHER): Payer: BC Managed Care – PPO | Admitting: Family Medicine

## 2019-04-21 VITALS — BP 115/76 | HR 78 | Temp 98.1°F | Ht 64.0 in | Wt 132.2 lb

## 2019-04-21 DIAGNOSIS — N3000 Acute cystitis without hematuria: Secondary | ICD-10-CM

## 2019-04-21 LAB — POCT URINALYSIS DIPSTICK
Bilirubin, UA: NEGATIVE
Blood, UA: NEGATIVE
Glucose, UA: NEGATIVE
Ketones, UA: NEGATIVE
Nitrite, UA: POSITIVE
Protein, UA: NEGATIVE
Spec Grav, UA: 1.015 (ref 1.010–1.025)
Urobilinogen, UA: 1 E.U./dL
pH, UA: 6.5 (ref 5.0–8.0)

## 2019-04-21 MED ORDER — NITROFURANTOIN MONOHYD MACRO 100 MG PO CAPS: 100.0000 mg | ORAL_CAPSULE | Freq: Two times a day (BID) | ORAL | 0 refills | Status: DC

## 2019-04-21 NOTE — Progress Notes (Signed)
Patient: Sheri Wilson MRN: [id number] DOB: Apr 21, 1996 PCP: Orma Flaming, MD     Subjective:  Chief Complaint  Patient presents with  . Urinary Tract Infection    HPI: The patient is a 23 y.o. female who presents today for burning with urination as well as urinary frequency x 2 days, but last night it got worse.  Denies fever or chills. She also has urgency. No visible blood in her urine. No odor or color change. No CVA tenderness or fevers. She has had 3 urine infections in her life.   Review of Systems  Constitutional: Negative for chills and fever.  Gastrointestinal: Negative for abdominal pain, nausea and vomiting.  Genitourinary: Positive for dysuria, frequency and urgency. Negative for flank pain and pelvic pain.  Musculoskeletal: Negative for back pain.    Allergies Patient is allergic to indomethacin and sulfa antibiotics.  Past Medical History Patient  has a past medical history of Asthma and Migraines.  Surgical History Patient  has a past surgical history that includes Arthrodesis foot with iliac crest bone graft (2011); Sesamoidectomy (Left, 2013); Shoulder arthroscopy w/ superior labral anterior posterior repair (Right, 2014); Shoulder arthroscopy w/ superior labral anterior posterior lesion repair (Right, 2015); fai repair (Right, 2016); FAI repair (Right, 2018); and Breast surgery (07/2018).  Family History [name]'s family history includes Colon cancer in her paternal grandfather.  Social History Patient  reports that she has never smoked. She has never used smokeless tobacco. She reports current alcohol use. She reports that she does not use drugs.    Objective: Vitals:   04/21/19 1134  BP: 115/76  Pulse: 78  Temp: 98.1 F (36.7 C)  TempSrc: Oral  SpO2: 98%  Weight: 132 lb 3.2 oz (60 kg)  Height: 5\' 4"  (1.626 m)    Body mass index is 22.69 kg/m.  Physical Exam Vitals signs reviewed.  Constitutional:      Appearance: Normal appearance. She  is normal weight.  HENT:     Head: Normocephalic and atraumatic.  Cardiovascular:     Rate and Rhythm: Normal rate and regular rhythm.     Heart sounds: Normal heart sounds.  Pulmonary:     Effort: Pulmonary effort is normal.     Breath sounds: Normal breath sounds.  Abdominal:     General: Abdomen is flat. Bowel sounds are normal.     Tenderness: There is no abdominal tenderness. There is no right CVA tenderness or left CVA tenderness.  Neurological:     General: No focal deficit present.     Mental Status: She is alert and oriented to person, place, and time.        Urine dipstick shows positive for nitrates and positive for leukocytes.    Assessment/plan: 1. Acute cystitis without hematuria Course of macorbid and can continue azo prn. Push fluids. F/u on culture precautions given.  - POCT Urinalysis Dipstick - Urine Culture   No follow-ups on file.   Orma Flaming, MD [place]   04/21/2019

## 2019-04-21 NOTE — Patient Instructions (Signed)
Urinary Tract Infection, Adult A urinary tract infection (UTI) is an infection of any part of the urinary tract. The urinary tract includes:  The kidneys.  The ureters.  The bladder.  The urethra. These organs make, store, and get rid of pee (urine) in the body. What are the causes? This is caused by germs (bacteria) in your genital area. These germs grow and cause swelling (inflammation) of your urinary tract. What increases the risk? You are more likely to develop this condition if:  You have a small, thin tube (catheter) to drain pee.  You cannot control when you pee or poop (incontinence).  You are female, and: ? You use these methods to prevent pregnancy: ? A medicine that kills sperm (spermicide). ? A device that blocks sperm (diaphragm). ? You have low levels of a female hormone (estrogen). ? You are pregnant.  You have genes that add to your risk.  You are sexually active.  You take antibiotic medicines.  You have trouble peeing because of: ? A prostate that is bigger than normal, if you are female. ? A blockage in the part of your body that drains pee from the bladder (urethra). ? A kidney stone. ? A nerve condition that affects your bladder (neurogenic bladder). ? Not getting enough to drink. ? Not peeing often enough.  You have other conditions, such as: ? Diabetes. ? A weak disease-fighting system (immune system). ? Sickle cell disease. ? Gout. ? Injury of the spine. What are the signs or symptoms? Symptoms of this condition include:  Needing to pee right away (urgently).  Peeing often.  Peeing small amounts often.  Pain or burning when peeing.  Blood in the pee.  Pee that smells bad or not like normal.  Trouble peeing.  Pee that is cloudy.  Fluid coming from the vagina, if you are female.  Pain in the belly or lower back. Other symptoms include:  Throwing up (vomiting).  No urge to eat.  Feeling mixed up (confused).  Being tired  and grouchy (irritable).  A fever.  Watery poop (diarrhea). How is this treated? This condition may be treated with:  Antibiotic medicine.  Other medicines.  Drinking enough water. Follow these instructions at home:  Medicines  Take over-the-counter and prescription medicines only as told by your doctor.  If you were prescribed an antibiotic medicine, take it as told by your doctor. Do not stop taking it even if you start to feel better. General instructions  Make sure you: ? Pee until your bladder is empty. ? Do not hold pee for a long time. ? Empty your bladder after sex. ? Wipe from front to back after pooping if you are a female. Use each tissue one time when you wipe.  Drink enough fluid to keep your pee pale yellow.  Keep all follow-up visits as told by your doctor. This is important. Contact a doctor if:  You do not get better after 1-2 days.  Your symptoms go away and then come back. Get help right away if:  You have very bad back pain.  You have very bad pain in your lower belly.  You have a fever.  You are sick to your stomach (nauseous).  You are throwing up. Summary  A urinary tract infection (UTI) is an infection of any part of the urinary tract.  This condition is caused by germs in your genital area.  There are many risk factors for a UTI. These include having a small, thin   tube to drain pee and not being able to control when you pee or poop.  Treatment includes antibiotic medicines for germs.  Drink enough fluid to keep your pee pale yellow. This information is not intended to replace advice given to you by your health care provider. Make sure you discuss any questions you have with your health care provider. Document Released: 01/06/2008 Document Revised: 07/07/2018 Document Reviewed: 01/27/2018 Elsevier Patient Education  2020 Elsevier Inc.  

## 2019-04-23 LAB — URINE CULTURE
MICRO NUMBER:: 898052
SPECIMEN QUALITY:: ADEQUATE

## 2019-04-26 ENCOUNTER — Ambulatory Visit: Payer: BC Managed Care – PPO | Admitting: Obstetrics & Gynecology

## 2019-04-27 ENCOUNTER — Ambulatory Visit: Payer: BC Managed Care – PPO

## 2019-05-15 ENCOUNTER — Ambulatory Visit: Payer: BC Managed Care – PPO

## 2019-05-16 ENCOUNTER — Ambulatory Visit (INDEPENDENT_AMBULATORY_CARE_PROVIDER_SITE_OTHER): Payer: BC Managed Care – PPO

## 2019-05-16 ENCOUNTER — Ambulatory Visit: Payer: BC Managed Care – PPO

## 2019-05-16 ENCOUNTER — Other Ambulatory Visit: Payer: Self-pay

## 2019-05-16 DIAGNOSIS — G43009 Migraine without aura, not intractable, without status migrainosus: Secondary | ICD-10-CM | POA: Diagnosis not present

## 2019-05-16 NOTE — Progress Notes (Signed)
Pt in for teaching to self-administer Aimovig at home.  Instructed patient on technique of self administering Aimovig at home.  Advised that injection is to be given SQ only.  Patient verbalized understanding.  Patient brought her Aimovig and after instructing on self-administration, I observed her inject the medication SQ into her stomach.  Patient successfully injected the medication and verbalized understanding of technique.

## 2019-05-19 ENCOUNTER — Ambulatory Visit: Payer: BC Managed Care – PPO | Admitting: Obstetrics & Gynecology

## 2019-05-30 ENCOUNTER — Other Ambulatory Visit: Payer: Self-pay

## 2019-05-30 ENCOUNTER — Ambulatory Visit (INDEPENDENT_AMBULATORY_CARE_PROVIDER_SITE_OTHER): Payer: BC Managed Care – PPO | Admitting: Family Medicine

## 2019-05-30 DIAGNOSIS — M546 Pain in thoracic spine: Secondary | ICD-10-CM

## 2019-05-30 MED ORDER — TIZANIDINE HCL 2 MG PO TABS: 2.0000 mg | ORAL_TABLET | Freq: Every evening | ORAL | 0 refills | Status: DC | PRN

## 2019-05-30 NOTE — Patient Instructions (Signed)
-  heat 15 minutes twice daily  -aleve as needed for pain per instructions  -can try the muscle relaxer (tizanidine) as needed per instructions  -home physical therapy - I asked my assistant to send these exercises to you, would recommend 3-4 days per week  -topical menthol (tiger balm) can sometimes help  -follow up in 2-3 weeks, sooner if worsening, new symptoms or not improving with treatment

## 2019-05-30 NOTE — Progress Notes (Signed)
Virtual Visit via Video Note  I connected with Sheri Wilson  on 05/30/19 at 11:40 AM EDT by a video enabled telemedicine application and verified that I am speaking with the correct person using two identifiers.  Location patient: home Location provider:work or home office Persons participating in the virtual visit: patient, provider  I discussed the limitations of evaluation and management by telemedicine and the availability of in person appointments. The patient expressed understanding and agreed to proceed.   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  Past Medical History:  Diagnosis Date  . Asthma   . Migraines     Past Surgical History:  Procedure Laterality Date  . ARTHRODESIS FOOT WITH ILIAC CREST BONE GRAFT  2011   bone graft L foot sesamoid bone  . BREAST SURGERY  07/2018   right breast biopsy  . fai repair Right 2016   FAI repair right hip  . FAI repair Right 2018   FAI repair/labrum repair right hip  . SESAMOIDECTOMY Left 2013  . SHOULDER ARTHROSCOPY W/ SUPERIOR LABRAL ANTERIOR POSTERIOR LESION REPAIR Right 2015  . SHOULDER ARTHROSCOPY W/ SUPERIOR LABRAL ANTERIOR POSTERIOR REPAIR Right 2014    Family History  Problem Relation Age of Onset  . Colon cancer Paternal Grandfather   . Cancer Neg Hx   . Diabetes Neg Hx   . Stroke Neg Hx   . Inflammatory bowel disease Neg Hx     SOCIAL HX: see hpi   Current Outpatient Medications:  .  alosetron (LOTRONEX) 0.5 MG tablet, Take 1 tablet (0.5 mg  total) by mouth 2 (two) times daily., Disp: 60 tablet, Rfl: 3 .  Cholecalciferol (VITAMIN D) 50 MCG (2000 UT) CAPS, Take by mouth., Disp: , Rfl:  .  dicyclomine (BENTYL) 20 MG tablet, Take 1 tablet (20 mg total) by mouth 4 (four) times daily -  before meals and at bedtime., Disp: 120 tablet, Rfl: 3 .  eletriptan (RELPAX) 20 MG tablet, Take 1 tablet (20 mg total) by mouth as needed for migraine or headache. May repeat in 2 hours if headache persists or recurs., Disp: 10 tablet, Rfl: 2 .  Erenumab-aooe (AIMOVIG) 70 MG/ML SOAJ, Inject 70 mg into the skin every 30 (thirty) days., Disp: 1 mL, Rfl: 11 .  nitrofurantoin, macrocrystal-monohydrate, (MACROBID) 100 MG capsule, Take 1 capsule (100 mg total) by mouth 2 (two) times daily., Disp: 14 capsule, Rfl: 0 .  Norethindrone-Ethinyl Estradiol-Fe Biphas (LO LOESTRIN FE) 1 MG-10 MCG / 10 MCG tablet, TAKE 1 TABLET BY MOUTH EVERY DAY, Disp: , Rfl:  .  promethazine (PHENERGAN) 12.5 MG tablet, Take 1 tablet (12.5 mg total) by mouth every 8 (eight) hours as needed for nausea or vomiting., Disp: 20 tablet, Rfl: 0 .  tiZANidine (ZANAFLEX) 2 MG tablet, Take 1 tablet (2 mg total) by mouth at bedtime as needed for muscle spasms., Disp: 20 tablet, Rfl: 0  EXAM:  VITALS per patient if applicable: denies fevers  GENERAL: alert, oriented, appears well and in no  acute distress  HEENT: atraumatic, conjunttiva clear, no obvious abnormalities on inspection of external nose and ears  NECK: normal movements of the head and neck  LUNGS: on inspection no signs of respiratory distress, breathing rate appears normal, no obvious gross SOB, gasping or wheezing  CV: no obvious cyanosis  MS: moves all visible extremities without noticeable abnormality; she points to area between spine and shoulder blade on the R around T 5-7, she can move head and neck in all directions, arms and rotate thoracic spine normal with some pain with certain movements stretching this  area  PSYCH/NEURO: pleasant and cooperative, no obvious depression or anxiety, speech and thought processing grossly intact  ASSESSMENT AND PLAN:  Discussed the following assessment and plan:  Acute right-sided thoracic back pain   -we discussed possible serious and likely etiologies, options for evaluation and workup, limitations of telemedicine visit vs in person visit, treatment, treatment risks and precautions. Pt prefers to treat via telemedicine empirically rather then risking or undertaking an in person visit at this moment. Suspect musculoskeletal etiology most likely. She opted for trial of naproxen (OTC - discussed dosingr/risks), home PT (asked assistant to send exercises), muscle relaxer, heat, topical menthol and close follow up in 2-3 weeks - sent message to [facility] schedulers to assist.  Patient agrees to seek prompt reevaluation or in person care sooner if worsening, new symptoms arise, or if is not improving with treatment.   I discussed the assessment and treatment plan with the patient. The patient was provided an opportunity to ask questions and all were answered. The patient agreed with the plan and demonstrated an understanding of the instructions.   The patient was advised to call back or seek an in-person evaluation if the symptoms worsen or if the condition fails to improve as anticipated.   Lucretia Kern, DO   Patient Instructions  -heat 15 minutes twice daily  -aleve as needed for pain per instructions  -can try the muscle relaxer (tizanidine) as needed per instructions  -home physical therapy - I asked my assistant to send these exercises to you, would recommend 3-4 days per week  -topical menthol (tiger balm) can sometimes help  -follow up in 2-3 weeks, sooner if worsening, new symptoms or not improving with treatment

## 2019-06-12 ENCOUNTER — Other Ambulatory Visit: Payer: Self-pay | Admitting: Gastroenterology

## 2019-06-14 ENCOUNTER — Ambulatory Visit (INDEPENDENT_AMBULATORY_CARE_PROVIDER_SITE_OTHER): Payer: BC Managed Care – PPO | Admitting: Family Medicine

## 2019-06-14 ENCOUNTER — Encounter: Payer: Self-pay | Admitting: Family Medicine

## 2019-06-14 VITALS — Temp 97.2°F | Ht 64.0 in | Wt 130.0 lb

## 2019-06-14 DIAGNOSIS — M62830 Muscle spasm of back: Secondary | ICD-10-CM

## 2019-06-14 MED ORDER — BACLOFEN 20 MG PO TABS: 20.0000 mg | ORAL_TABLET | Freq: Three times a day (TID) | ORAL | 1 refills | Status: DC

## 2019-06-14 NOTE — Progress Notes (Signed)
Patient: Sheri Wilson MRN: [id number] DOB: September 27, 1995 PCP: Orland Mustard, MD     I connected with [name] on 06/14/19 at 1:41pm by a video enabled telemedicine application and verified that I am speaking with the correct person using two identifiers.  Location patient: Home Location provider: [facility], Office Persons participating in this virtual visit: Jill Stopka and DR. Artis Flock   I discussed the limitations of evaluation and management by telemedicine and the availability of in person appointments. The patient expressed understanding and agreed to proceed.   Subjective:  Chief Complaint  Patient presents with  . Back Pain    HPI: The patient is a 23 y.o. female who presents today for back pain. Was seen on 05/30/19 via telehealth with Dr. Selena Batten for same issue. Was given tizanidine and home exercises to try. Also was going to see her chiropractor about 2 days/week. He said she has a knot in her shoulder blade that he is trying to work out. 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 If she wasn't sitting, she is much better. It's right along her right shoulder blade and she can feel the knot. She thinks it may have gotten a little bit better since she had a visit with D.r .Selena Batten. She also has taken the muscle relaxer, but it knocks her out and she can only take it at night. She has tried stretching, correct posture, heating pad, nsaids. She works out about 5x/week and really does concentrate on having a tight core. No radicular symptoms or weakness in her arms/neck.    Review of Systems  Constitutional: Negative for chills, fatigue and fever.  HENT: Positive for congestion. Negative for rhinorrhea and sinus pain.   Respiratory: Negative for cough and shortness of breath.   Cardiovascular: Negative for chest pain and palpitations.  Gastrointestinal: Negative for abdominal pain, diarrhea, nausea and vomiting.  Musculoskeletal: Positive for back pain and  myalgias.  Neurological: Negative for dizziness, speech difficulty, weakness, numbness and headaches.    Allergies Patient is allergic to indomethacin and sulfa antibiotics.  Past Medical History Patient  has a past medical history of Asthma and Migraines.  Surgical History Patient  has a past surgical history that includes Arthrodesis foot with iliac crest bone graft (2011); Sesamoidectomy (Left, 2013); Shoulder arthroscopy w/ superior labral anterior posterior repair (Right, 2014); Shoulder arthroscopy w/ superior labral anterior posterior lesion repair (Right, 2015); fai repair (Right, 2016); FAI repair (Right, 2018); and Breast surgery (07/2018).  Family History [name]'s family history includes Colon cancer in her paternal grandfather.  Social History Patient  reports that she has never smoked. She has never used smokeless tobacco. She reports current alcohol use. She reports that she does not use drugs.    Objective: Vitals:   06/14/19 1124  Temp: (!) 97.2 F (36.2 C)  Weight: 130 lb (59 kg)  Height: 5\' 4"  (1.626 m)    Body mass index is 22.31 kg/m.  Physical Exam Vitals signs reviewed.  Constitutional:      General: She is not in acute distress.    Appearance: Normal appearance. She is normal weight.  HENT:     Head: Normocephalic and atraumatic.  Pulmonary:     Effort: Pulmonary effort is normal.  Musculoskeletal: Normal range of motion.        General: Tenderness (under right scapula per patient ) present.  Neurological:     General: No focal deficit present.     Mental Status: She is alert and  oriented to person, place, and time.     Gait: Gait normal.        Assessment/plan: 1. Muscle spasm of back likely rhomboid on right vs. Scapular dysfunction from poor posture and sitting all day. Recommended she continue to do conservative therapy. Want her to get a back brace, start massage therapy, continue core strengthening and work on posture while sitting. No  indication for xray at this point. Also will change her muscle relaxer to baclofen so she doesn't gt as drowsy. If not making any improvement will send to PT, but she would like to hold off on thisfor now.       Return if symptoms worsen or fail to improve.  Records requested if needed. Time spent with patient: 15 minutes, of which >50% was spent in obtaining information about her symptoms, reviweing her previous labs, evaluations, and treatments, counseling her about her conditions (please see discussed topics above), and developing a plan to further investigate it; she had a number of questions which I addressed.    Orma Flaming, MD [place]  06/14/2019

## 2019-06-23 ENCOUNTER — Encounter: Payer: Self-pay | Admitting: Family Medicine

## 2019-06-23 ENCOUNTER — Ambulatory Visit: Payer: BC Managed Care – PPO | Admitting: Family Medicine

## 2019-06-23 ENCOUNTER — Ambulatory Visit (INDEPENDENT_AMBULATORY_CARE_PROVIDER_SITE_OTHER): Payer: BC Managed Care – PPO | Admitting: Family Medicine

## 2019-06-23 DIAGNOSIS — J0100 Acute maxillary sinusitis, unspecified: Secondary | ICD-10-CM | POA: Diagnosis not present

## 2019-06-23 MED ORDER — AMOXICILLIN-POT CLAVULANATE 875-125 MG PO TABS: 1.0000 | ORAL_TABLET | Freq: Two times a day (BID) | ORAL | 0 refills | Status: DC

## 2019-06-23 NOTE — Progress Notes (Signed)
Virtual Visit via Video Note  Subjective  CC:  Chief Complaint  Patient presents with  . Sinusitis    x4 days  . Laryngitis   Same day acute visit; PCP not available. New pt to me. Chart reviewed.   I connected with Sheri Wilson on 06/23/19 at  2:20 PM EST by a video enabled telemedicine application and verified that I am speaking with the correct person using two identifiers. Location patient: Home Location provider: [facility], Office Persons participating in the virtual visit: Bobbyjo Marulanda, Leamon Arnt, MD Gertie Exon, CMA  I discussed the limitations of evaluation and management by telemedicine and the availability of in person appointments. The patient expressed understanding and agreed to proceed. HPI: Sheri Wilson is a 23 y.o. female who was contacted today to address the problems listed above in the chief complaint. . Laryngitis started 4 days ago. Then sinus pain and pressure with thick discharge developed. Now with sinus headache as well. Typically will get one sinus infection per year. No f/c/s, cough, loss of taste or smell, GI sxs. No covid exposures. No sob.  Assessment  1. Acute non-recurrent maxillary sinusitis      Plan   sinsusitis:  Augmentin, flonase, decongestants and advil as needed. IF new sxs develop like fever cough or sob, recommend covid testing. Patient understands and agrees with care plan.   I discussed the assessment and treatment plan with the patient. The patient was provided an opportunity to ask questions and all were answered. The patient agreed with the plan and demonstrated an understanding of the instructions.   The patient was advised to call back or seek an in-person evaluation if the symptoms worsen or if the condition fails to improve as anticipated. Follow up: No follow-ups on file.  Visit date not found  Meds ordered this encounter  Medications  . amoxicillin-clavulanate (AUGMENTIN)  875-125 MG tablet    Sig: Take 1 tablet by mouth 2 (two) times daily.    Dispense:  20 tablet    Refill:  0      I reviewed the patients updated PMH, FH, and SocHx.    Patient Active Problem List   Diagnosis Date Noted  . 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 Vitamin D deficiency 05/24/2018  . Migraine headache without aura 03/21/2018  . Generalized anxiety disorder 03/08/2018   Current Meds  Medication Sig  . baclofen (LIORESAL) 20 MG tablet Take 1 tablet (20 mg total) by mouth 3 (three) times daily.  . Cholecalciferol (VITAMIN D) 50 MCG (2000 UT) CAPS Take by mouth.  . eletriptan (RELPAX) 20 MG tablet Take 1 tablet (20 mg total) by mouth as needed for migraine or headache. May repeat in 2 hours if headache persists or recurs.  Eduard Roux (AIMOVIG) 70 MG/ML SOAJ Inject 70 mg into the skin every 30 (thirty) days.  . Norethindrone-Ethinyl Estradiol-Fe Biphas (LO LOESTRIN FE) 1 MG-10 MCG / 10 MCG tablet TAKE 1 TABLET BY MOUTH EVERY DAY    Allergies: Patient is allergic to indomethacin and sulfa antibiotics. Family History: Patient family history includes Colon cancer in her paternal grandfather. Social History:  Patient  reports that she has never smoked. She has never used smokeless tobacco. She reports current alcohol use. She reports that she does not use drugs.  Review of Systems: Constitutional: Negative for fever malaise or anorexia Cardiovascular: negative for chest pain Respiratory: negative for SOB or persistent cough Gastrointestinal: negative for  abdominal pain  OBJECTIVE Vitals: There were no vitals taken for this visit. General: no acute distress , A&Ox3 Nasal congestion present. No respiratory distress Points to maxillary sinuses when describing pain  Willow Ora, MD

## 2019-07-13 ENCOUNTER — Ambulatory Visit: Payer: BC Managed Care – PPO | Admitting: Obstetrics & Gynecology

## 2019-07-13 ENCOUNTER — Other Ambulatory Visit: Payer: Self-pay

## 2019-07-13 MED ORDER — LO LOESTRIN FE 1 MG-10 MCG / 10 MCG PO TABS: 1.0000 | ORAL_TABLET | Freq: Every day | ORAL | 1 refills | Status: DC

## 2019-07-13 NOTE — Telephone Encounter (Signed)
Patient had to cancel annual exam due to potential covid exposure. Patient sent in refill of birth control pills to get her until appointment. Kathrene Alu RN

## 2019-08-23 ENCOUNTER — Other Ambulatory Visit: Payer: Self-pay

## 2019-08-23 ENCOUNTER — Encounter: Payer: Self-pay | Admitting: Obstetrics & Gynecology

## 2019-08-23 ENCOUNTER — Ambulatory Visit (INDEPENDENT_AMBULATORY_CARE_PROVIDER_SITE_OTHER): Payer: BC Managed Care – PPO | Admitting: Obstetrics & Gynecology

## 2019-08-23 VITALS — BP 123/72 | HR 69 | Ht 64.0 in | Wt 139.0 lb

## 2019-08-23 DIAGNOSIS — Z124 Encounter for screening for malignant neoplasm of cervix: Secondary | ICD-10-CM | POA: Diagnosis not present

## 2019-08-23 DIAGNOSIS — Z01419 Encounter for gynecological examination (general) (routine) without abnormal findings: Secondary | ICD-10-CM

## 2019-08-23 DIAGNOSIS — Z3009 Encounter for other general counseling and advice on contraception: Secondary | ICD-10-CM | POA: Diagnosis not present

## 2019-08-23 MED ORDER — NORETHINDRONE ACET-ETHINYL EST 1.5-30 MG-MCG PO TABS: 1.0000 | ORAL_TABLET | Freq: Every day | ORAL | 11 refills | Status: DC

## 2019-08-23 NOTE — Progress Notes (Signed)
Patient reports lab pap two years - has no history of abnormal paps. Armandina Stammer RN

## 2019-08-23 NOTE — Progress Notes (Signed)
Subjective:     Sheri Wilson is a 24 y.o. female here for a routine exam. G0P0 Current complaints: Pt has been on OCPs but, has noted BTB and dysmenorrhea with the current dosage. She denies remembering to take the [facility]  Gynecologic History Patient's last menstrual period was 08/09/2019 (exact date). 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 Rec f/'u for routine screening at age 89 years.   Obstetric History OB History  No obstetric history on file.   The following portions of the patient's history were reviewed and updated as appropriate: allergies, current medications, past family history, past medical history, past social history, past surgical history and problem list.  Review of Systems Pertinent items are noted in HPI.    Objective:  BP 123/72   Pulse 69   Ht 5\' 4"  (1.626 m)   Wt 139 lb (63 kg)   LMP 08/09/2019 (Exact Date)   BMI 23.86 kg/m   General Appearance:    Alert, cooperative, no distress, appears stated age  Head:    Normocephalic, without obvious abnormality, atraumatic  Eyes:    conjunctiva/corneas clear, EOM's intact, both eyes  Ears:    Normal external ear canals, both ears  Nose:   Nares normal, septum midline, mucosa normal, no drainage    or sinus tenderness  Throat:   Lips, mucosa, and tongue normal; teeth and gums normal  Neck:   Supple, symmetrical, trachea midline, no adenopathy;    thyroid:  no enlargement/tenderness/nodules  Back:     Symmetric, no curvature, ROM normal, no CVA tenderness  Lungs:     respirations unlabored  Chest Wall:    No tenderness or deformity   Heart:    Regular rate and rhythm  Breast Exam:    No tenderness, masses, or nipple abnormality  Abdomen:     Soft, non-tender, bowel sounds active all four quadrants,    no masses, no organomegaly  Genitalia:    Normal female without lesion, discharge or  tenderness     Extremities:   Extremities normal, atraumatic, no cyanosis or edema  Pulses:   2+ and symmetric all extremities  Skin:   Skin color, texture, turgor normal, no rashes or lesions    Assessment:    Healthy female exam.   Contraception counseling- Pt wants to stay on OCPs. Will increase dosage.      Plan:  F/u PAP. No cx Loestrin 1.5/30 1 po q day F/u in 1 year or sooner prn  Tahra Hitzeman L. Harraway-Smith, M.D., [birth year]-1982

## 2019-08-23 NOTE — Patient Instructions (Signed)
Oral Contraception Use Oral contraceptive pills (OCPs) are medicines that you take to prevent pregnancy. OCPs work by:  Preventing the ovaries from releasing eggs.  Thickening mucus in the lower part of the uterus (cervix), which prevents sperm from entering the uterus.  Thinning the lining of the uterus (endometrium), which prevents a fertilized egg from attaching to the endometrium. OCPs are highly effective when taken exactly as prescribed. However, OCPs do not prevent sexually transmitted infections (STIs). Safe sex practices, such as using condoms while on an OCP, can help prevent STIs. Before taking OCPs, you may have a physical exam, blood test, and Pap test. A Pap test involves taking a sample of cells from your cervix to check for cancer. Discuss with your health care provider the possible side effects of the OCP you may be prescribed. When you start an OCP, be aware that it can take 2-3 months for your body to adjust to changes in hormone levels. How to take oral contraceptive pills Follow instructions from your health care provider about how to start taking your first cycle of OCPs. Your health care provider may recommend that you:  Start the pill on day 1 of your menstrual period. If you start at this time, you will not need any backup form of birth control (contraception), such as condoms.  Start the pill on the first Sunday after your menstrual period or on the day you get your prescription. In these cases, you will need to use backup contraception for the first week.  Start the pill at any time of your cycle. ? If you take the pill within 5 days of the start of your period, you will not need a backup form of contraception. ? If you start at any other time of your menstrual cycle, you will need to use another form of contraception for 7 days. If your OCP is the type called a minipill, it will protect you from pregnancy after taking it for 2 days (48 hours), and you can stop using  backup contraception after that time. After you have started taking OCPs:  If you forget to take 1 pill, take it as soon as you remember. Take the next pill at the regular time.  If you miss 2 or more pills, call your health care provider. Different pills have different instructions for missed doses. Use backup birth control until your next menstrual period starts.  If you use a 28-day pack that contains inactive pills and you miss 1 of the last 7 pills (pills with no hormones), throw away the rest of the non-hormone pills and start a new pill pack. No matter which day you start the OCP, you will always start a new pack on that same day of the week. Have an extra pack of OCPs and a backup contraceptive method available in case you miss some pills or lose your OCP pack. Follow these instructions at home:  Do not use any products that contain nicotine or tobacco, such as cigarettes and e-cigarettes. If you need help quitting, ask your health care provider.  Always use a condom to protect against STIs. OCPs do not protect against STIs.  Use a calendar to mark the days of your menstrual period.  Read the information and directions that came with your OCP. Talk to your health care provider if you have questions. Contact a health care provider if:  You develop nausea and vomiting.  You have abnormal vaginal discharge or bleeding.  You develop a rash.    You miss your menstrual period. Depending on the type of OCP you are taking, this may be a sign of pregnancy. Ask your health care provider for more information.  You are losing your hair.  You need treatment for mood swings or depression.  You get dizzy when taking the OCP.  You develop acne after taking the OCP.  You become pregnant or think you may be pregnant.  You have diarrhea, constipation, and abdominal pain or cramps.  You miss 2 or more pills. Get help right away if:  You develop chest pain.  You develop shortness of  breath.  You have an uncontrolled or severe headache.  You develop numbness or slurred speech.  You develop visual or speech problems.  You develop pain, redness, and swelling in your legs.  You develop weakness or numbness in your arms or legs. Summary  Oral contraceptive pills (OCPs) are medicines that you take to prevent pregnancy.  OCPs do not prevent sexually transmitted infections (STIs). Always use a condom to protect against STIs.  When you start an OCP, be aware that it can take 2-3 months for your body to adjust to changes in hormone levels.  Read all the information and directions that come with your OCP. This information is not intended to replace advice given to you by your health care provider. Make sure you discuss any questions you have with your health care provider. Document Revised: 11/11/2018 Document Reviewed: 08/31/2016 Elsevier Patient Education  2020 Elsevier Inc.  

## 2019-08-28 LAB — CYTOLOGY - PAP: Diagnosis: NEGATIVE

## 2020-02-21 ENCOUNTER — Other Ambulatory Visit: Payer: Self-pay | Admitting: Family Medicine

## 2020-03-07 ENCOUNTER — Ambulatory Visit: Payer: BC Managed Care – PPO | Admitting: Family Medicine

## 2020-04-11 ENCOUNTER — Encounter: Payer: Self-pay | Admitting: Family Medicine

## 2020-04-15 ENCOUNTER — Other Ambulatory Visit: Payer: Self-pay

## 2020-04-15 MED ORDER — ELETRIPTAN HYDROBROMIDE 20 MG PO TABS: ORAL_TABLET | ORAL | 2 refills | Status: DC

## 2020-04-22 ENCOUNTER — Other Ambulatory Visit: Payer: Self-pay | Admitting: Family Medicine

## 2020-05-31 IMAGING — US ULTRASOUND RIGHT BREAST LIMITED
1 series · 1 of 1 positions shown · non-contrast
Comparison: 03/24/2018

CLINICAL DATA: Patient presents for repeat ultrasound of the right
breast. She has right lower outer quadrant breast swelling and
tenderness, for which she was evaluated on 03/24/2018 with breast
ultrasound. This examination revealed normally normal appearing
tissue. However, since that exam, the swelling and pain has
worsened. Repeat evaluation as well as biopsy is requested.

EXAM:
ULTRASOUND OF THE RIGHT BREAST

[Series 1: ultrasound right breast limited · 0.06mm/px · 1 of 1 slices shown]
[im 1/1]
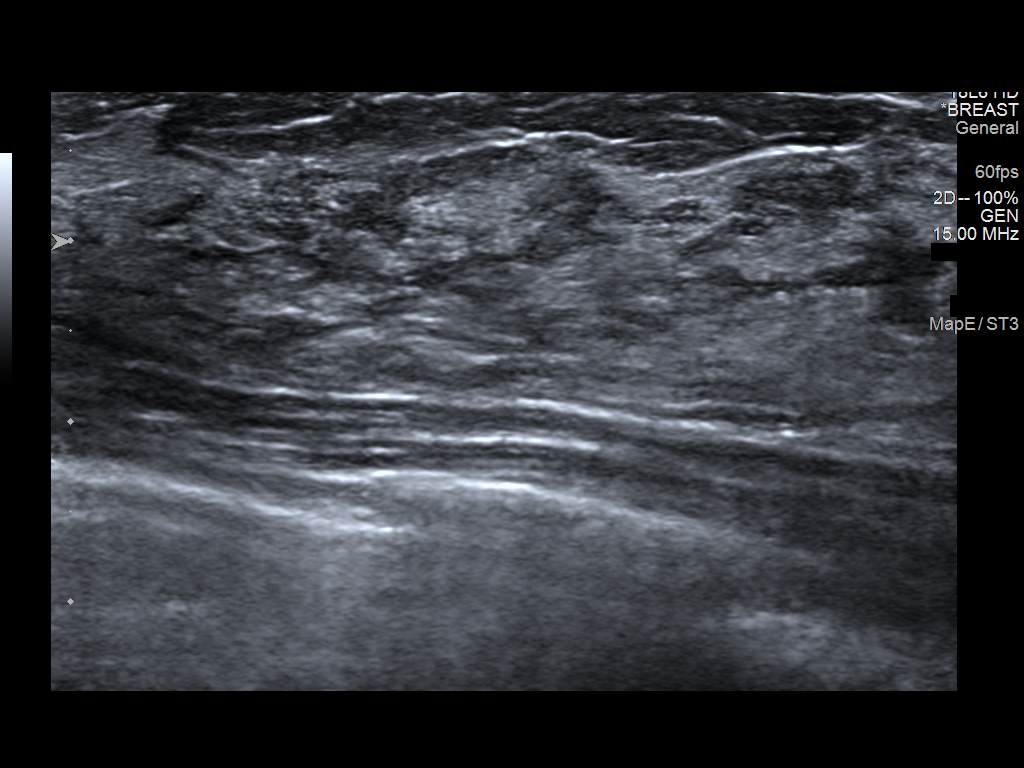

[1 of 1 positions shown; findings below may reference images not displayed]

FINDINGS: On physical exam, there is prominent tissue in the lower outer
quadrant right breast with associated tenderness. No defined mass.

Targeted ultrasound is performed, showing heterogeneous
fibroglandular tissue throughout the lower outer quadrant right
breast. No discrete mass. No suspicious lesion or abnormal
shadowing.
IMPRESSION: 1. No sonographic abnormality is seen to correspond to the area of
breast swelling and tenderness. However, on exam, this area is
relatively well-defined. Biopsy will be performed to further ensure
that this reflects normal tissue or a benign proliferative process.

RECOMMENDATION:
1. Biopsy of the area of prominence in the lower outer quadrant
right breast were performed. The area of heterogeneous
fibroglandular tissue will be targeted.
2. If the pathology from the biopsy is negative or benign, screening
mammography beginning age 40 would be recommended with clinical
follow-up for the breast swelling and tenderness.

I have discussed the findings and recommendations with the patient.
Results were also provided in writing at the conclusion of the
visit. If applicable, a reminder letter will be sent to the patient
regarding the next appointment.

BI-RADS CATEGORY  1: Negative.

## 2020-07-04 LAB — HM PAP SMEAR: HM Pap smear: NEGATIVE

## 2020-07-10 ENCOUNTER — Other Ambulatory Visit: Payer: Self-pay

## 2020-07-10 ENCOUNTER — Encounter: Payer: Self-pay | Admitting: Family Medicine

## 2020-07-10 ENCOUNTER — Ambulatory Visit (INDEPENDENT_AMBULATORY_CARE_PROVIDER_SITE_OTHER): Payer: BC Managed Care – PPO | Admitting: Family Medicine

## 2020-07-10 VITALS — BP 119/78 | HR 90 | Temp 98.3°F | Ht 64.0 in | Wt 139.8 lb

## 2020-07-10 DIAGNOSIS — L237 Allergic contact dermatitis due to plants, except food: Secondary | ICD-10-CM

## 2020-07-10 MED ORDER — METHYLPREDNISOLONE ACETATE 80 MG/ML IJ SUSP: 40.0000 mg | Freq: Once | INTRAMUSCULAR | Status: DC

## 2020-07-10 MED ORDER — METHYLPREDNISOLONE ACETATE 40 MG/ML IJ SUSP: 40.0000 mg | Freq: Once | INTRAMUSCULAR | Status: DC

## 2020-07-10 MED ORDER — METHYLPREDNISOLONE ACETATE 80 MG/ML IJ SUSP: 80.0000 mg | Freq: Once | INTRAMUSCULAR | Status: DC

## 2020-07-10 MED ORDER — TRIAMCINOLONE ACETONIDE 0.5 % EX OINT: 1.0000 "application " | TOPICAL_OINTMENT | Freq: Two times a day (BID) | CUTANEOUS | 0 refills | Status: DC

## 2020-07-10 NOTE — Addendum Note (Signed)
Addended by: Orland Mustard on: 07/10/2020 02:14 PM   Modules accepted: Orders

## 2020-07-10 NOTE — Patient Instructions (Signed)
Poison Ivy Dermatitis Poison ivy dermatitis is redness and soreness of the skin caused by chemicals in the leaves of the poison ivy plant. You may have very bad itching, swelling, a rash, and blisters. What are the causes?  Touching a poison ivy plant.  Touching something that has the chemical on it. This may include animals or objects that have come in contact with the plant. What increases the risk?  Going outdoors often in wooded or marshy areas.  Going outdoors without wearing protective clothing, such as closed shoes, long pants, and a long-sleeved shirt. What are the signs or symptoms?   Skin redness.  Very bad itching.  A rash that often includes bumps and blisters. ? The rash usually appears 48 hours after exposure, if you have been exposed before. ? If this is the first time you have been exposed, the rash may not appear until a week after exposure.  Swelling. This may occur if the reaction is very bad. Symptoms usually last for 1-2 weeks. The first time you develop this condition, symptoms may last 3-4 weeks. How is this treated? This condition may be treated with:  Hydrocortisone cream or calamine lotion to relieve itching.  Oatmeal baths to soothe the skin.  Medicines, such as over-the-counter antihistamine tablets.  Oral steroid medicine for more severe reactions. Follow these instructions at home: Medicines  Take or apply over-the-counter and prescription medicines only as told by your doctor.  Use hydrocortisone cream or calamine lotion as needed to help with itching. General instructions  Do not scratch or rub your skin.  Put a cold, wet cloth (cold compress) on the affected areas or take baths in cool water. This will help with itching.  Avoid hot baths and showers.  Take oatmeal baths as needed. Use colloidal oatmeal. You can get this at a pharmacy or grocery store. Follow the instructions on the package.  While you have the rash, wash your clothes  right after you wear them.  Keep all follow-up visits as told by your health care provider. This is important. How is this prevented?   Know what poison ivy looks like, so you can avoid it. ? This plant has three leaves with flowering branches on a single stem. ? The leaves are glossy. ? The leaves have uneven edges that come to a point at the front.  If you touch poison ivy, wash your skin with soap and water right away. Be sure to wash under your fingernails.  When hiking or camping, wear long pants, a long-sleeved shirt, tall socks, and hiking boots. You can also use a lotion on your skin that helps to prevent contact with poison ivy.  If you think that your clothes or outdoor gear came in contact with poison ivy, rinse them off with a garden hose before you bring them inside your house.  When doing yard work or gardening, wear gloves, long sleeves, long pants, and boots. Wash your garden tools and gloves if they come in contact with poison ivy.  If you think that your pet has come into contact with poison ivy, wash him or her with pet shampoo and water. Make sure to wear gloves while washing your pet. Contact a doctor if:  You have open sores in the rash area.  You have more redness, swelling, or pain in the rash area.  You have redness that spreads beyond the rash area.  You have fluid, blood, or pus coming from the rash area.  You have a   fever.  You have a rash over a large area of your body.  You have a rash on your eyes, mouth, or genitals.  Your rash does not get better after a few weeks. Get help right away if:  Your face swells or your eyes swell shut.  You have trouble breathing.  You have trouble swallowing. These symptoms may be an emergency. Do not wait to see if the symptoms will go away. Get medical help right away. Call your local emergency services (911 in the [place] Do not drive yourself to the hospital. Summary  Poison ivy dermatitis is redness and  soreness of the skin caused by chemicals in the leaves of the poison ivy plant.  You may have skin redness, very bad itching, swelling, and a rash.  Do not scratch or rub your skin.  Take or apply over-the-counter and prescription medicines only as told by your doctor. This information is not intended to replace advice given to you by your health care provider. Make sure you discuss any questions you have with your health care provider. Document Revised: 11/11/2018 Document Reviewed: 07/15/2018 Elsevier Patient Education  2020 [employer]vier Inc.  

## 2020-07-10 NOTE — Progress Notes (Signed)
Patient: Sheri Wilson MRN: [id number] DOB: 1996-01-06 PCP: Orland Mustard, MD     Subjective:  Chief Complaint  Patient presents with  . Poison Ivy    HPI: The patient is a 24 y.o. female who presents today for poison ivy. She says it started about 2 weeks ago, the day before Thanksgiving to be exact. She says that it probably came from her dogs because they play in her backyard a lot.  She did go camping 3 weeks ago, but she wasn't near any vegetation. She states it's in between her legs and then onto her buttocks. She has had this 3 times this summer. She states it is very itchy and wearing pants makes it more irritated. She has been using cortisone cream.   Review of Systems  Constitutional: Negative for chills and fever.  Gastrointestinal: Negative for nausea and vomiting.  Skin: Positive for color change and rash.    Allergies Patient is allergic to indomethacin and sulfa antibiotics.  Past Medical History Patient  has a past medical history of Asthma and Migraines.  Surgical History Patient  has a past surgical history that includes Arthrodesis foot with iliac crest bone graft (2011); Sesamoidectomy (Left, 2013); Shoulder arthroscopy w/ superior labral anterior posterior repair (Right, 2014); Shoulder arthroscopy w/ superior labral anterior posterior lesion repair (Right, 2015); fai repair (Right, 2016); FAI repair (Right, 2018); and Breast surgery (07/2018).  Family History [name]'s family history includes Colon cancer in her paternal grandfather.  Social History Patient  reports that she has never smoked. She has never used smokeless tobacco. She reports current alcohol use. She reports that she does not use drugs.    Objective: Vitals:   07/10/20 1257  BP: 119/78  Pulse: 90  Temp: 98.3 F (36.8 C)  TempSrc: Temporal  Weight: 139 lb 12.8 oz (63.4 kg)  Height: 5\' 4"  (1.626 m)    Body mass index is 24 kg/m.  Physical Exam Vitals reviewed.   Constitutional:      Appearance: Normal appearance. She is normal weight.  HENT:     Head: Normocephalic and atraumatic.  Pulmonary:     Effort: Pulmonary effort is normal.  Skin:    Comments: Erythematous streaking rash on left medial upper leg. Scabbed over. Cluster on right buttocks and then left posterior superior leg.   Neurological:     General: No focal deficit present.     Mental Status: She is alert and oriented to person, place, and time.  Psychiatric:        Mood and Affect: Mood normal.        Behavior: Behavior normal.        Assessment/plan: 1. Poison ivy -steroid shot today -topical steroid cream bid x 10 days -can take benadryl to help her sleep at night -cool wraps -wear gloves when washing dogs.  - methylPREDNISolone acetate (DEPO-MEDROL) injection 40 mg    This visit occurred during the SARS-CoV-2 public health emergency.  Safety protocols were in place, including screening questions prior to the visit, additional usage of staff PPE, and extensive cleaning of exam room while observing appropriate contact time as indicated for disinfecting solutions.     Return if symptoms worsen or fail to improve.   , MD [facility]   07/10/2020

## 2020-08-01 ENCOUNTER — Other Ambulatory Visit: Payer: Self-pay

## 2020-08-01 ENCOUNTER — Encounter: Payer: Self-pay | Admitting: Family Medicine

## 2020-08-01 ENCOUNTER — Ambulatory Visit (INDEPENDENT_AMBULATORY_CARE_PROVIDER_SITE_OTHER): Payer: BC Managed Care – PPO | Admitting: Family Medicine

## 2020-08-01 VITALS — BP 100/58 | HR 83 | Temp 97.8°F | Ht 64.0 in | Wt 139.2 lb

## 2020-08-01 DIAGNOSIS — E538 Deficiency of other specified B group vitamins: Secondary | ICD-10-CM

## 2020-08-01 DIAGNOSIS — E559 Vitamin D deficiency, unspecified: Secondary | ICD-10-CM

## 2020-08-01 DIAGNOSIS — F411 Generalized anxiety disorder: Secondary | ICD-10-CM

## 2020-08-01 DIAGNOSIS — Z1159 Encounter for screening for other viral diseases: Secondary | ICD-10-CM

## 2020-08-01 DIAGNOSIS — Z0000 Encounter for general adult medical examination without abnormal findings: Secondary | ICD-10-CM | POA: Diagnosis not present

## 2020-08-01 LAB — COMPREHENSIVE METABOLIC PANEL
ALT: 21 U/L (ref 0–35)
AST: 18 U/L (ref 0–37)
Albumin: 4.7 g/dL (ref 3.5–5.2)
Alkaline Phosphatase: 48 U/L (ref 39–117)
BUN: 13 mg/dL (ref 6–23)
CO2: 28 mEq/L (ref 19–32)
Calcium: 9.2 mg/dL (ref 8.4–10.5)
Chloride: 103 mEq/L (ref 96–112)
Creatinine, Ser: 0.71 mg/dL (ref 0.40–1.20)
GFR: 117.15 mL/min (ref >60.00)
Glucose, Bld: 90 mg/dL (ref 70–99)
Potassium: 4.1 mEq/L (ref 3.5–5.1)
Sodium: 138 mEq/L (ref 135–145)
Total Bilirubin: 0.6 mg/dL (ref 0.2–1.2)
Total Protein: 7.2 g/dL (ref 6.0–8.3)

## 2020-08-01 LAB — CBC WITH DIFFERENTIAL/PLATELET
Basophils Absolute: 0 10*3/uL (ref 0.0–0.1)
Basophils Relative: 0.6 % (ref 0.0–3.0)
Eosinophils Absolute: 0.1 10*3/uL (ref 0.0–0.7)
Eosinophils Relative: 1.9 % (ref 0.0–5.0)
HCT: 41.2 % (ref 36.0–46.0)
Hemoglobin: 14.1 g/dL (ref 12.0–15.0)
Lymphocytes Relative: 29.8 % (ref 12.0–46.0)
Lymphs Abs: 1.6 10*3/uL (ref 0.7–4.0)
MCHC: 34.2 g/dL (ref 30.0–36.0)
MCV: 83.9 fl (ref 78.0–100.0)
Monocytes Absolute: 0.4 10*3/uL (ref 0.1–1.0)
Monocytes Relative: 7.3 % (ref 3.0–12.0)
Neutro Abs: 3.2 10*3/uL (ref 1.4–7.7)
Neutrophils Relative %: 60.4 % (ref 43.0–77.0)
Platelets: 199 10*3/uL (ref 150.0–400.0)
RBC: 4.91 Mil/uL (ref 3.87–5.11)
RDW: 12.3 % (ref 11.5–15.5)
WBC: 5.4 10*3/uL (ref 4.0–10.5)

## 2020-08-01 LAB — LIPID PANEL
Cholesterol: 189 mg/dL (ref 0–200)
HDL: 67.7 mg/dL (ref >39.00)
LDL Cholesterol: 102 mg/dL — ABNORMAL HIGH (ref 0–99)
NonHDL: 121.49
Total CHOL/HDL Ratio: 3
Triglycerides: 98 mg/dL (ref 0.0–149.0)
VLDL: 19.6 mg/dL (ref 0.0–40.0)

## 2020-08-01 LAB — TSH: TSH: 1.77 u[IU]/mL (ref 0.35–4.50)

## 2020-08-01 LAB — VITAMIN B12: Vitamin B-12: 273 pg/mL (ref 211–911)

## 2020-08-01 LAB — VITAMIN D 25 HYDROXY (VIT D DEFICIENCY, FRACTURES): VITD: 43.14 ng/mL (ref 30.00–100.00)

## 2020-08-01 NOTE — Progress Notes (Signed)
Patient: Sheri Wilson MRN: [id number] DOB: 08/06/1995 PCP: Orland Mustard, MD     Subjective:  Chief Complaint  Patient presents with  . Annual Exam  . Anxiety    HPI: The patient is a 24 y.o. female who presents today for annual exam.  denies any changes to past medical history. There have been no recent hospitalizations. They are following a well balanced diet and exercise plan. Weight has been stable. No complaints today.   No family history of breast or colon cancer.   She stopped taking her birth control about 1 month ago. She has been on this x 12 years and wanted to come off of this. She feels like her anxiety is actually better off her birth control. She has tried depo shot (bled entire time) and about 6-7 different ocp. She would like to try condoms. Also would like recommendation for new gyn.   Immunization History  Administered Date(s) Administered  . Influenza Inj Mdck Quad Pf 05/12/2018  . Influenza,inj,Quad PF,6+ Mos 03/20/2019  . Influenza-Unspecified 05/10/2018  . PPD Test 01/28/2018  . Tdap 01/14/2017   Colonoscopy: routine screening  Mammogram: routine screening  Pap smear: 08/23/2019   Review of Systems  Constitutional: Negative for chills, fatigue and fever.  HENT: Negative for dental problem, ear pain, hearing loss and trouble swallowing.   Eyes: Negative for visual disturbance.  Respiratory: Negative for cough, chest tightness and shortness of breath.   Cardiovascular: Negative for chest pain, palpitations and leg swelling.  Gastrointestinal: Negative for abdominal pain, blood in stool, diarrhea and nausea.  Endocrine: Negative for cold intolerance, polydipsia, polyphagia and polyuria.  Genitourinary: Negative for dysuria and hematuria.  Musculoskeletal: Negative for arthralgias.  Skin: Negative for rash.  Neurological: Negative for dizziness and headaches.  Psychiatric/Behavioral: Negative for dysphoric mood and sleep disturbance. The patient is  not nervous/anxious.     Allergies Patient is allergic to indomethacin and sulfa antibiotics.  Past Medical History Patient  has a past medical history of Asthma and Migraines.  Surgical History Patient  has a past surgical history that includes Arthrodesis foot with iliac crest bone graft (2011); Sesamoidectomy (Left, 2013); Shoulder arthroscopy w/ superior labral anterior posterior repair (Right, 2014); Shoulder arthroscopy w/ superior labral anterior posterior lesion repair (Right, 2015); fai repair (Right, 2016); FAI repair (Right, 2018); and Breast surgery (07/2018).  Family History [name]'s family history includes Colon cancer in her paternal grandfather.  Social History Patient  reports that she has never smoked. She has never used smokeless tobacco. She reports current alcohol use. She reports that she does not use drugs.    Objective: Vitals:   08/01/20 0804  BP: (!) 100/58  Pulse: 83  Temp: 97.8 F (36.6 C)  TempSrc: Temporal  SpO2: 100%  Weight: 139 lb 3.2 oz (63.1 kg)  Height: 5\' 4"  (1.626 m)    Body mass index is 23.89 kg/m.  Physical Exam Vitals reviewed.  Constitutional:      Appearance: Normal appearance. She is well-developed, normal weight and well-nourished.  HENT:     Head: Normocephalic and atraumatic.     Right Ear: Tympanic membrane, ear canal and external ear normal.     Left Ear: Tympanic membrane, ear canal and external ear normal.     Mouth/Throat:     Mouth: Oropharynx is clear and moist. Mucous membranes are moist.     Comments: Cobblestoning on posterior pharynx  Eyes:     Extraocular Movements: Extraocular movements intact and EOM normal.  Conjunctiva/sclera: Conjunctivae normal.     Pupils: Pupils are equal, round, and reactive to light.  Neck:     Thyroid: No thyromegaly.  Cardiovascular:     Rate and Rhythm: Normal rate and regular rhythm.     Pulses: Normal pulses and intact distal pulses.     Heart sounds: Normal heart  sounds. No murmur heard.   Pulmonary:     Effort: Pulmonary effort is normal.     Breath sounds: Normal breath sounds.  Abdominal:     General: Abdomen is flat. Bowel sounds are normal. There is no distension.     Palpations: Abdomen is soft.     Tenderness: There is no abdominal tenderness.  Musculoskeletal:     Cervical back: Normal range of motion and neck supple.  Lymphadenopathy:     Cervical: No cervical adenopathy.  Skin:    General: Skin is warm and dry.     Capillary Refill: Capillary refill takes less than 2 seconds.     Findings: No rash.  Neurological:     General: No focal deficit present.     Mental Status: She is alert and oriented to person, place, and time.     Cranial Nerves: No cranial nerve deficit.     Coordination: Coordination normal.     Deep Tendon Reflexes: Reflexes normal.  Psychiatric:        Mood and Affect: Mood and affect and mood normal.        Behavior: Behavior normal.      GAD7: 1  Assessment/plan: 1. Annual physical exam Routine fasting labs today. HM reviewed with her. Gyn information given. Recommended condoms if does not desire pregnancy and to start PNV when ready to try. Also needs to discuss headache medicine with neurologist. Recommended not taking relpax in pregnancy. aimovig has no human data available. Encouraged exercise and healthy diet. F/u in one year.  Patient counseling [x]    Nutrition: Stressed importance of moderation in sodium/caffeine intake, saturated fat and cholesterol, caloric balance, sufficient intake of fresh fruits, vegetables, fiber, calcium, iron, and 1 mg of folate supplement per day (for females capable of pregnancy).  [x]    Stressed the importance of regular exercise.   []    Substance Abuse: Discussed cessation/primary prevention of tobacco, alcohol, or other drug use; driving or other dangerous activities under the influence; availability of treatment for abuse.   [x]    Injury prevention: Discussed safety  belts, safety helmets, smoke detector, smoking near bedding or upholstery.   [x]    Sexuality: Discussed sexually transmitted diseases, partner selection, use of condoms, avoidance of unintended pregnancy  and contraceptive alternatives.  [x]    Dental health: Discussed importance of regular tooth brushing, flossing, and dental visits.  [x]    Health maintenance and immunizations reviewed. Please refer to Health maintenance section.    - CBC with Differential/Platelet - Comprehensive metabolic panel - Lipid panel - TSH  2. Encounter for hepatitis C screening test for low risk patient  - Hepatitis C antibody  3. Vitamin D deficiency  - VITAMIN D 25 Hydroxy (Vit-D Deficiency, Fractures)  4. Disorder of vitamin B12  - Vitamin B12  5. Generalized anxiety disorder gad7 extremely well controlled. Does not need any medication at this time. Would rather her do counseling first if needed and exercise, but she will keep me in loop if symptoms worsen.    This visit occurred during the SARS-CoV-2 public health emergency.  Safety protocols were in place, including screening questions prior to the visit,  additional usage of staff PPE, and extensive cleaning of exam room while observing appropriate contact time as indicated for disinfecting solutions.    Return in about 1 year (around 08/01/2021) for annual .     Orland Mustard, MD [facility]  08/01/2020

## 2020-08-01 NOTE — Patient Instructions (Addendum)
1) Dr. Sumner Boast or Edwinna Areola (gynecology only)  2) Dr. Aloha Gell at [facility] (does ob and gyn)   Let me know if anxiety is getting worse. Start prenatal vitamin when ready to start trying to have kids and talk to neurology about headache medication.   Happy new year! Dr. Rogers Blocker     Preventive Care 57-26 Years Old, Female Preventive care refers to visits with your health care provider and lifestyle choices that can promote health and wellness. This includes:  A yearly physical exam. This may also be called an annual well check.  Regular dental visits and eye exams.  Immunizations.  Screening for certain conditions.  Healthy lifestyle choices, such as eating a healthy diet, getting regular exercise, not using drugs or products that contain nicotine and tobacco, and limiting alcohol use. What can I expect for my preventive care visit? Physical exam Your health care provider will check your:  Height and weight. This may be used to calculate body mass index (BMI), which tells if you are at a healthy weight.  Heart rate and blood pressure.  Skin for abnormal spots. Counseling Your health care provider may ask you questions about your:  Alcohol, tobacco, and drug use.  Emotional well-being.  Home and relationship well-being.  Sexual activity.  Eating habits.  Work and work Statistician.  Method of birth control.  Menstrual cycle.  Pregnancy history. What immunizations do I need?  Influenza (flu) vaccine  This is recommended every year. Tetanus, diphtheria, and pertussis (Tdap) vaccine  You may need a Td booster every 10 years. Varicella (chickenpox) vaccine  You may need this if you have not been vaccinated. Human papillomavirus (HPV) vaccine  If recommended by your health care provider, you may need three doses over 6 months. Measles, mumps, and rubella (MMR) vaccine  You may need at least one dose of MMR. You may also need a second  dose. Meningococcal conjugate (MenACWY) vaccine  One dose is recommended if you are age 57-21 years and a first-year college student living in a residence hall, or if you have one of several medical conditions. You may also need additional booster doses. Pneumococcal conjugate (PCV13) vaccine  You may need this if you have certain conditions and were not previously vaccinated. Pneumococcal polysaccharide (PPSV23) vaccine  You may need one or two doses if you smoke cigarettes or if you have certain conditions. Hepatitis A vaccine  You may need this if you have certain conditions or if you travel or work in places where you may be exposed to hepatitis A. Hepatitis B vaccine  You may need this if you have certain conditions or if you travel or work in places where you may be exposed to hepatitis B. Haemophilus influenzae type b (Hib) vaccine  You may need this if you have certain conditions. You may receive vaccines as individual doses or as more than one vaccine together in one shot (combination vaccines). Talk with your health care provider about the risks and benefits of combination vaccines. What tests do I need?  Blood tests  Lipid and cholesterol levels. These may be checked every 5 years starting at age 49.  Hepatitis C test.  Hepatitis B test. Screening  Diabetes screening. This is done by checking your blood sugar (glucose) after you have not eaten for a while (fasting).  Sexually transmitted disease (STD) testing.  BRCA-related cancer screening. This may be done if you have a family history of breast, ovarian, tubal, or peritoneal cancers.  Pelvic exam and Pap test. This may be done every 3 years starting at age 21. Starting at age 30, this may be done every 5 years if you have a Pap test in combination with an HPV test. Talk with your health care provider about your test results, treatment options, and if necessary, the need for more tests. Follow these instructions at  home: Eating and drinking   Eat a diet that includes fresh fruits and vegetables, whole grains, lean protein, and low-fat dairy.  Take vitamin and mineral supplements as recommended by your health care provider.  Do not drink alcohol if: ? Your health care provider tells you not to drink. ? You are pregnant, may be pregnant, or are planning to become pregnant.  If you drink alcohol: ? Limit how much you have to 0-1 drink a day. ? Be aware of how much alcohol is in your drink. In the [place] one drink equals one 12 oz bottle of beer (355 mL), one 5 oz glass of wine (148 mL), or one 1 oz glass of hard liquor (44 mL). Lifestyle  Take daily care of your teeth and gums.  Stay active. Exercise for at least 30 minutes on 5 or more days each week.  Do not use any products that contain nicotine or tobacco, such as cigarettes, e-cigarettes, and chewing tobacco. If you need help quitting, ask your health care provider.  If you are sexually active, practice safe sex. Use a condom or other form of birth control (contraception) in order to prevent pregnancy and STIs (sexually transmitted infections). If you plan to become pregnant, see your health care provider for a preconception visit. What's next?  Visit your health care provider once a year for a well check visit.  Ask your health care provider how often you should have your eyes and teeth checked.  Stay up to date on all vaccines. This information is not intended to replace advice given to you by your health care provider. Make sure you discuss any questions you have with your health care provider. Document Revised: 03/31/2018 Document Reviewed: 03/31/2018 Elsevier Patient Education  2020 Elsevier Inc.  

## 2020-08-05 LAB — HEPATITIS C ANTIBODY
Hepatitis C Ab: NONREACTIVE
SIGNAL TO CUT-OFF: 0.08 (ref <1.00)

## 2020-09-24 ENCOUNTER — Telehealth (INDEPENDENT_AMBULATORY_CARE_PROVIDER_SITE_OTHER): Payer: BC Managed Care – PPO | Admitting: Physician Assistant

## 2020-09-24 DIAGNOSIS — J0100 Acute maxillary sinusitis, unspecified: Secondary | ICD-10-CM

## 2020-09-24 MED ORDER — BENZONATATE 100 MG PO CAPS: 100.0000 mg | ORAL_CAPSULE | Freq: Three times a day (TID) | ORAL | 0 refills | Status: AC | PRN

## 2020-09-24 NOTE — Progress Notes (Signed)
Virtual Visit via Video Note  I connected with Sheri Wilson on 09/24/20 at 11:00 AM EST by a video enabled telemedicine application and verified that I am speaking with the correct person using two identifiers.  Location: Patient: home Provider: Nature conservation officer at [employer]  I discussed the limitations of evaluation and management by telemedicine and the availability of in person appointments. The patient expressed understanding and agreed to proceed. Only the patient and myself were present for today's video call.   History of Present Illness: Chief complaint: Cough, hoarseness Symptom onset: 08/20/20 Pertinent positives: Dry cough, sinus congestion, postnasal drip, hoarse, ST (resolved) Pertinent negatives: Fever, chills, body aches, fatigue, SOB Treatments tried: She has tried throat lozenges, tea, OTC cough suppressant. Allegra and Flonase as well.  Vaccine status: Has not had COVID vaccines. Flu vaccine in the Fall. Sick exposure: No sick contacts.   Took a rapid COVID test 09/22/20 at [facility] and it was negative.    Observations/Objective:   Gen: Awake, alert, no acute distress. Sounds slightly congested and has some raspiness to her voice. Resp: Breathing is even and non-labored Psych: calm/pleasant demeanor Neuro: Alert and Oriented x 3, + facial symmetry, speech is clear.   Assessment and Plan:  1. Acute non-recurrent maxillary sinusitis COVID negative as of 09/22/20, do not recommend retesting. Likely viral sinusitis, common for patient. Conservative tx advised at this time. Continue use of Allegra, Flonase, fluids, nasal saline. Rx for tessalon perles.   Follow Up Instructions:    I discussed the assessment and treatment plan with the patient. The patient was provided an opportunity to ask questions and all were answered. The patient agreed with the plan and demonstrated an understanding of the instructions.   The patient was advised to call back or  seek an in-person evaluation if the symptoms worsen or if the condition fails to improve as anticipated.  Laneah Luft M Quitman Norberto, PA-C

## 2020-09-24 NOTE — Patient Instructions (Signed)
Sinusitis, Adult Sinusitis is inflammation of your sinuses. Sinuses are hollow spaces in the bones around your face. Your sinuses are located:  Around your eyes.  In the middle of your forehead.  Behind your nose.  In your cheekbones. Mucus normally drains out of your sinuses. When your nasal tissues become inflamed or swollen, mucus can become trapped or blocked. This allows bacteria, viruses, and fungi to grow, which leads to infection. Most infections of the sinuses are caused by a virus. Sinusitis can develop quickly. It can last for up to 4 weeks (acute) or for more than 12 weeks (chronic). Sinusitis often develops after a cold. What are the causes? This condition is caused by anything that creates swelling in the sinuses or stops mucus from draining. This includes:  Allergies.  Asthma.  Infection from bacteria or viruses.  Deformities or blockages in your nose or sinuses.  Abnormal growths in the nose (nasal polyps).  Pollutants, such as chemicals or irritants in the air.  Infection from fungi (rare). What increases the risk? You are more likely to develop this condition if you:  Have a weak body defense system (immune system).  Do a lot of swimming or diving.  Overuse nasal sprays.  Smoke. What are the signs or symptoms? The main symptoms of this condition are pain and a feeling of pressure around the affected sinuses. Other symptoms include:  Stuffy nose or congestion.  Thick drainage from your nose.  Swelling and warmth over the affected sinuses.  Headache.  Upper toothache.  A cough that may get worse at night.  Extra mucus that collects in the throat or the back of the nose (postnasal drip).  Decreased sense of smell and taste.  Fatigue.  A fever.  Sore throat.  Bad breath. How is this diagnosed? This condition is diagnosed based on:  Your symptoms.  Your medical history.  A physical exam.  Tests to find out if your condition is  acute or chronic. This may include: ? Checking your nose for nasal polyps. ? Viewing your sinuses using a device that has a light (endoscope). ? Testing for allergies or bacteria. ? Imaging tests, such as an MRI or CT scan. In rare cases, a bone biopsy may be done to rule out more serious types of fungal sinus disease. How is this treated? Treatment for sinusitis depends on the cause and whether your condition is chronic or acute.  If caused by a virus, your symptoms should go away on their own within 10 days. You may be given medicines to relieve symptoms. They include: ? Medicines that shrink swollen nasal passages (topical intranasal decongestants). ? Medicines that treat allergies (antihistamines). ? A spray that eases inflammation of the nostrils (topical intranasal corticosteroids). ? Rinses that help get rid of thick mucus in your nose (nasal saline washes).  If caused by bacteria, your health care provider may recommend waiting to see if your symptoms improve. Most bacterial infections will get better without antibiotic medicine. You may be given antibiotics if you have: ? A severe infection. ? A weak immune system.  If caused by narrow nasal passages or nasal polyps, you may need to have surgery. Follow these instructions at home: Medicines  Take, use, or apply over-the-counter and prescription medicines only as told by your health care provider. These may include nasal sprays.  If you were prescribed an antibiotic medicine, take it as told by your health care provider. Do not stop taking the antibiotic even if you start   to feel better. Hydrate and humidify  Drink enough fluid to keep your urine pale yellow. Staying hydrated will help to thin your mucus.  Use a cool mist humidifier to keep the humidity level in your home above 50%.  Inhale steam for 10-15 minutes, 3-4 times a day, or as told by your health care provider. You can do this in the bathroom while a hot shower is  running.  Limit your exposure to cool or dry air.   Rest  Rest as much as possible.  Sleep with your head raised (elevated).  Make sure you get enough sleep each night. General instructions  Apply a warm, moist washcloth to your face 3-4 times a day or as told by your health care provider. This will help with discomfort.  Wash your hands often with soap and water to reduce your exposure to germs. If soap and water are not available, use hand sanitizer.  Do not smoke. Avoid being around people who are smoking (secondhand smoke).  Keep all follow-up visits as told by your health care provider. This is important.   Contact a health care provider if:  You have a fever.  Your symptoms get worse.  Your symptoms do not improve within 10 days. Get help right away if:  You have a severe headache.  You have persistent vomiting.  You have severe pain or swelling around your face or eyes.  You have vision problems.  You develop confusion.  Your neck is stiff.  You have trouble breathing. Summary  Sinusitis is soreness and inflammation of your sinuses. Sinuses are hollow spaces in the bones around your face.  This condition is caused by nasal tissues that become inflamed or swollen. The swelling traps or blocks the flow of mucus. This allows bacteria, viruses, and fungi to grow, which leads to infection.  If you were prescribed an antibiotic medicine, take it as told by your health care provider. Do not stop taking the antibiotic even if you start to feel better.  Keep all follow-up visits as told by your health care provider. This is important. This information is not intended to replace advice given to you by your health care provider. Make sure you discuss any questions you have with your health care provider. Document Revised: 12/20/2017 Document Reviewed: 12/20/2017 Elsevier Patient Education  2021 Elsevier Inc.  

## 2020-09-30 ENCOUNTER — Encounter: Payer: Self-pay | Admitting: Family Medicine

## 2020-10-09 MED ORDER — AJOVY 225 MG/1.5ML ~~LOC~~ SOAJ: 225.0000 mg | SUBCUTANEOUS | 1 refills | Status: DC

## 2020-12-15 ENCOUNTER — Other Ambulatory Visit: Payer: Self-pay | Admitting: Family Medicine

## 2021-01-17 ENCOUNTER — Ambulatory Visit: Payer: Self-pay

## 2021-01-17 NOTE — Progress Notes (Signed)
   Covid-19 Vaccination Clinic  Name:  Emmy Keng    MRN: [id number] DOB: Dec 29, 1995  01/17/2021  Ms. Ives was observed post Covid-19 immunization for 15 minutes without incident. She was provided with Vaccine Information Sheet and instruction to access the V-Safe system.   Ms. Palmatier was instructed to call 911 with any severe reactions post vaccine: Difficulty breathing  Swelling of face and throat  A fast heartbeat  A bad rash all over body  Dizziness and weakness

## 2021-01-20 ENCOUNTER — Other Ambulatory Visit (HOSPITAL_BASED_OUTPATIENT_CLINIC_OR_DEPARTMENT_OTHER): Payer: Self-pay

## 2021-01-20 MED ORDER — COVID-19 AD26 VACCINE(JANSSEN) 0.5 ML IM SUSP
INTRAMUSCULAR | 0 refills | Status: DC
  Filled 2021-01-20: qty 0.5, 1d supply, fill #0

## 2021-04-11 ENCOUNTER — Other Ambulatory Visit: Payer: Self-pay | Admitting: Family Medicine

## 2021-04-18 ENCOUNTER — Telehealth: Payer: Self-pay

## 2021-04-18 NOTE — Telephone Encounter (Signed)
.   Encourage patient to contact the pharmacy for refills or they can request refills through [facility]  LAST APPOINTMENT DATE:  Please schedule appointment if longer than 1 year  NEXT APPOINTMENT DATE:  MEDICATION:AJOVY 225 MG/1.5ML SOAJ  Is the patient out of medication?   [id number] [place], [place] - [address] AT [facility]  Let patient know to contact pharmacy at the end of the day to make sure medication is ready.  Please notify patient to allow 48-72 hours to process  CLINICAL FILLS OUT ALL BELOW:   LAST REFILL:  QTY:  REFILL DATE:    OTHER COMMENTS:    Okay for refill?  Please advise

## 2021-04-21 NOTE — Telephone Encounter (Signed)
Medication was filled on September 9th.

## 2021-04-22 NOTE — Telephone Encounter (Signed)
Spoke with patient, let her know we need her insurance on file before I can begin a PA.

## 2021-04-22 NOTE — Telephone Encounter (Signed)
Patient is calling in stating that the medication is needing a prior authorization.

## 2021-04-25 NOTE — Telephone Encounter (Signed)
Patient needs an appointment with a PCP. Dr.Andy did not prescribe this medication

## 2021-04-29 ENCOUNTER — Encounter: Payer: Self-pay | Admitting: Physician Assistant

## 2021-04-29 ENCOUNTER — Telehealth: Payer: Self-pay

## 2021-04-29 ENCOUNTER — Telehealth (INDEPENDENT_AMBULATORY_CARE_PROVIDER_SITE_OTHER): Payer: BC Managed Care – PPO | Admitting: Physician Assistant

## 2021-04-29 VITALS — Ht 64.0 in | Wt 135.0 lb

## 2021-04-29 DIAGNOSIS — G43009 Migraine without aura, not intractable, without status migrainosus: Secondary | ICD-10-CM

## 2021-04-29 MED ORDER — ELETRIPTAN HYDROBROMIDE 20 MG PO TABS: ORAL_TABLET | ORAL | 2 refills | Status: DC

## 2021-04-29 MED ORDER — AJOVY 225 MG/1.5ML ~~LOC~~ SOAJ: 225.0000 mg | SUBCUTANEOUS | 2 refills | Status: DC

## 2021-04-29 NOTE — Progress Notes (Signed)
Virtual Visit via Video   I connected with Sheri Wilson on 04/29/21 at  9:30 AM EDT by a video enabled telemedicine application and verified that I am speaking with the correct person using two identifiers. Location patient: Home Location provider: [facility], Office Persons participating in the virtual visit: Sheri Wilson, Sheri Motto PA-C, Corky Mull, LPN   I discussed the limitations of evaluation and management by telemedicine and the availability of in person appointments. The patient expressed understanding and agreed to proceed.  I acted as a Neurosurgeon for [employer], PA-C [employer], LPN   Subjective:   HPI:   Migraines Pt is needing refill on Relpax 20 mg and Ajovy 225 mg injection for Migraines. Pt said she has been with out Ajovy for 7 weeks now and she has used all of her relpax due to this. Medication is working well for her. Pt does have a mild headache today. Pt is scheduled for transfer of care 08/05/2021 with Alyssa.  She reports that her HA symptoms are her typical symptoms. She does not have worsening pain, one-sided weakness, slurred speech, changes in balance.   ROS: See pertinent positives and negatives per HPI.  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 MAY REPEAT IN 2 HOURS IF HEADACHE PERSISTS OR RECURS., Disp: 10 tablet, Rfl: 2   Fremanezumab-vfrm (AJOVY) 225 MG/1.5ML SOAJ, Inject 225 mg into the skin  every 30 (thirty) days., Disp: 4.5 mL, Rfl: 2  Allergies  Allergen Reactions   Indomethacin Other (See Comments)    Vision impairment   Sulfa Antibiotics Hives    Objective:   VITALS: Per patient if applicable, see vitals. GENERAL: Alert, appears well and in no acute distress. HEENT: Atraumatic, conjunctiva clear, no obvious abnormalities on inspection of external nose and ears. NECK: Normal movements of the head and neck. CARDIOPULMONARY: No increased WOB. Speaking in clear sentences. I:E ratio WNL.  MS: Moves all visible extremities without noticeable abnormality. PSYCH: Pleasant and cooperative, well-groomed. Speech normal rate and rhythm. Affect is appropriate. Insight and judgement are appropriate. Attention is focused, linear, and appropriate.  NEURO: CN grossly intact. Oriented as arrived to appointment on time with no prompting. Moves both UE equally.  SKIN: No obvious lesions, wounds, erythema, or cyanosis noted on face or hands.  Assessment and Plan:   [name] was seen today for medication refill.  Diagnoses and all orders for this visit:  Migraine without aura and without status migrainosus, not intractable  Other orders -     eletriptan (RELPAX) 20 MG tablet; TAKE 1 TABLET BY MOUTH AS NEEDED FOR MIGRAINE OR HEADACHE. MAY REPEAT IN 2 HOURS IF HEADACHE PERSISTS OR RECURS. -     Fremanezumab-vfrm (AJOVY) 225 MG/1.5ML SOAJ; Inject 225 mg into the skin every 30 (thirty) days.  Poorly controlled due to lapse in medication Restart ajovy, refill today Refill prn relpax Follow-up as needed with [place]  Worsening precautions advised  I discussed the assessment and treatment plan with the patient. The patient was provided an opportunity to ask questions and all were answered. The patient agreed with the plan and demonstrated an understanding of the instructions.   The patient was advised to call back or seek an in-person evaluation if the symptoms worsen or if the condition  fails to improve as anticipated.   CMA or LPN served as scribe during this visit. History, Physical, and Plan performed by medical provider. The above documentation has been reviewed and is accurate and complete.   [place], [place] 04/29/2021

## 2021-04-29 NOTE — Telephone Encounter (Signed)
Prior authorization started through Covermymeds, awaiting response.

## 2021-04-30 ENCOUNTER — Encounter: Payer: Self-pay | Admitting: Physician Assistant

## 2021-04-30 NOTE — Telephone Encounter (Signed)
Received fax from [facility] regarding PA for Ajovy has been denied.

## 2021-05-01 NOTE — Telephone Encounter (Signed)
[facility] and spoke to Shawn told him Pa has been approved for Ajovy for one year. Shawn verbalized understanding and will get ready for pt.

## 2021-05-01 NOTE — Telephone Encounter (Signed)
Spoke to pt told her Sheri Wilson has been approved for one year. I will call the pharmacy now and have them get it ready for you. Pt verbalized understanding.

## 2021-05-01 NOTE — Telephone Encounter (Signed)
Called [facility] and spoke to [employer], told her calling about Prior Auth that was denied for Ajovy. I want to know what I can do to fix? Lauren said is pt taking antoher CGP Receptor antagonist like emgality, other injections? Told her no, pt is not on any other injections. Lauren verbalized understanding and said we can do PA over the phone now if you like. I said yes please. PA done and approved for one year. Told her thank you.

## 2021-05-01 NOTE — Telephone Encounter (Signed)
Pt notified PA approved for Ajovy. See other messages.

## 2021-05-20 ENCOUNTER — Ambulatory Visit: Payer: BC Managed Care – PPO | Admitting: Family Medicine

## 2021-05-20 ENCOUNTER — Other Ambulatory Visit: Payer: Self-pay

## 2021-05-20 VITALS — BP 90/60 | HR 65 | Temp 98.0°F | Ht 64.0 in | Wt 143.2 lb

## 2021-05-20 DIAGNOSIS — G43909 Migraine, unspecified, not intractable, without status migrainosus: Secondary | ICD-10-CM | POA: Diagnosis not present

## 2021-05-20 MED ORDER — KETOROLAC TROMETHAMINE 60 MG/2ML IM SOLN
60.0000 mg | Freq: Once | INTRAMUSCULAR | Status: AC
  Administered 2021-05-20: 60 mg via INTRAMUSCULAR

## 2021-05-20 NOTE — Progress Notes (Signed)
Patient ID: Sheri Wilson, female    DOB: 08-Feb-1996, 25 y.o.   MRN: [id number]  This visit was conducted in person.  BP 90/60   Pulse 65   Temp 98 F (36.7 C) (Temporal)   Ht 5\' 4"  (1.626 m)   Wt 143 lb 4 oz (65 kg)   LMP 05/15/2021   SpO2 98%   BMI 24.59 kg/m    CC: Chief Complaint  Patient presents with   Migraine    Since Sunday    Subjective:   HPI: Sheri Wilson is a 25 y.o. female with history of migraine presenting on 05/20/2021 for Migraine (Since Sunday)  Per record review: She was seen on 9/27 by PA 10/27 for refill of relpax and Ajovy. She had been out for 8 weeks at that this and was restarted on Ajovy as it had been working well for her in prevention of migraine ( 1-2 more mild migraine a month). She is schedule for transfer of care with [name], [facility] 08/05/2021  She reports  in last 3 days she has had new onset migraine 10/10 upon awaking, associated with photo and phonphobia, nausea and emesis.   Too zofran and  relplax x 2 Improved some.  Repeat relpax again the following day.  Has also tried 2 benadryl and a phenergan Took another relpax yesterday.  Today migraine  persists.10/03/2021 4-5/10 on pain scale.  Had to leave work given the pain.  Associated with nausea as well.   No known trigger.  No new numbness, weakness, no slurred speech, no vision change.  Normal gait.  No confusion.  No aura.        Relevant past medical, surgical, family and social history reviewed and updated as indicated. Interim medical history since our last visit reviewed. Allergies and medications reviewed and updated. Outpatient Medications Prior to Visit  Medication Sig Dispense Refill   Cholecalciferol (VITAMIN D) 50 MCG (2000 UT) CAPS Take 1 capsule by mouth daily.     eletriptan (RELPAX) 20 MG tablet TAKE 1 TABLET BY MOUTH AS NEEDED FOR MIGRAINE OR HEADACHE. MAY REPEAT IN 2 HOURS IF HEADACHE PERSISTS OR RECURS. 10 tablet 2   Fremanezumab-vfrm  (AJOVY) 225 MG/1.5ML SOAJ Inject 225 mg into the skin every 30 (thirty) days. 4.5 mL 2   No facility-administered medications prior to visit.     Per HPI unless specifically indicated in ROS section below Review of Systems  Constitutional:  Negative for fatigue and fever.  HENT:  Negative for ear pain.   Eyes:  Negative for pain.  Respiratory:  Negative for chest tightness and shortness of breath.   Cardiovascular:  Negative for chest pain, palpitations and leg swelling.  Gastrointestinal:  Negative for abdominal pain.  Genitourinary:  Negative for dysuria.  Objective:  BP 90/60   Pulse 65   Temp 98 F (36.7 C) (Temporal)   Ht 5\' 4"  (1.626 m)   Wt 143 lb 4 oz (65 kg)   LMP 05/15/2021   SpO2 98%   BMI 24.59 kg/m   Wt Readings from Last 3 Encounters:  05/20/21 143 lb 4 oz (65 kg)  04/29/21 135 lb (61.2 kg)  08/01/20 139 lb 3.2 oz (63.1 kg)      Physical Exam Constitutional:      General: She is not in acute distress.    Appearance: Normal appearance. She is well-developed. She is not ill-appearing or toxic-appearing.  HENT:     Head: Normocephalic.  Right Ear: Hearing, tympanic membrane, ear canal and external ear normal. Tympanic membrane is not erythematous, retracted or bulging.     Left Ear: Hearing, tympanic membrane, ear canal and external ear normal. Tympanic membrane is not erythematous, retracted or bulging.     Nose: No mucosal edema or rhinorrhea.     Right Sinus: No maxillary sinus tenderness or frontal sinus tenderness.     Left Sinus: No maxillary sinus tenderness or frontal sinus tenderness.     Mouth/Throat:     Pharynx: Uvula midline.  Eyes:     General: Lids are normal. Lids are everted, no foreign bodies appreciated.     Conjunctiva/sclera: Conjunctivae normal.     Pupils: Pupils are equal, round, and reactive to light.  Neck:     Thyroid: No thyroid mass or thyromegaly.     Vascular: No carotid bruit.     Trachea: Trachea normal.   Cardiovascular:     Rate and Rhythm: Normal rate and regular rhythm.     Pulses: Normal pulses.     Heart sounds: Normal heart sounds, S1 normal and S2 normal. No murmur heard.   No friction rub. No gallop.  Pulmonary:     Effort: Pulmonary effort is normal. No tachypnea or respiratory distress.     Breath sounds: Normal breath sounds. No decreased breath sounds, wheezing, rhonchi or rales.  Abdominal:     General: Bowel sounds are normal.     Palpations: Abdomen is soft.     Tenderness: There is no abdominal tenderness.  Musculoskeletal:     Cervical back: Normal range of motion and neck supple.  Skin:    General: Skin is warm and dry.     Findings: No rash.  Neurological:     Mental Status: She is alert and oriented to person, place, and time.     GCS: GCS eye subscore is 4. GCS verbal subscore is 5. GCS motor subscore is 6.     Cranial Nerves: No cranial nerve deficit.     Sensory: No sensory deficit.     Motor: No abnormal muscle tone.     Coordination: Coordination normal.     Gait: Gait normal.     Deep Tendon Reflexes: Reflexes are normal and symmetric.     Comments: Nml cerebellar exam   No papilledema  Psychiatric:        Mood and Affect: Mood is not anxious or depressed.        Speech: Speech normal.        Behavior: Behavior normal. Behavior is cooperative.        Thought Content: Thought content normal.        Cognition and Memory: Memory is not impaired. She does not exhibit impaired recent memory or impaired remote memory.        Judgment: Judgment normal.      Results for orders placed or performed in visit on 08/01/20  CBC with Differential/Platelet  Result Value Ref Range   WBC 5.4 4.0 - 10.5 K/uL   RBC 4.91 3.87 - 5.11 Mil/uL   Hemoglobin 14.1 12.0 - 15.0 g/dL   HCT 32.3 55.7 - 32.2 %   MCV 83.9 78.0 - 100.0 fl   MCHC 34.2 30.0 - 36.0 g/dL   RDW 02.5 42.7 - 06.2 %   Platelets 199.0 150.0 - 400.0 K/uL   Neutrophils Relative % 60.4 43.0 - 77.0 %    Lymphocytes Relative 29.8 12.0 - 46.0 %   Monocytes Relative  7.3 3.0 - 12.0 %   Eosinophils Relative 1.9 0.0 - 5.0 %   Basophils Relative 0.6 0.0 - 3.0 %   Neutro Abs 3.2 1.4 - 7.7 K/uL   Lymphs Abs 1.6 0.7 - 4.0 K/uL   Monocytes Absolute 0.4 0.1 - 1.0 K/uL   Eosinophils Absolute 0.1 0.0 - 0.7 K/uL   Basophils Absolute 0.0 0.0 - 0.1 K/uL  Comprehensive metabolic panel  Result Value Ref Range   Sodium 138 135 - 145 mEq/L   Potassium 4.1 3.5 - 5.1 mEq/L   Chloride 103 96 - 112 mEq/L   CO2 28 19 - 32 mEq/L   Glucose, Bld 90 70 - 99 mg/dL   BUN 13 6 - 23 mg/dL   Creatinine, Ser 4.09 0.40 - 1.20 mg/dL   Total Bilirubin 0.6 0.2 - 1.2 mg/dL   Alkaline Phosphatase 48 39 - 117 U/L   AST 18 0 - 37 U/L   ALT 21 0 - 35 U/L   Total Protein 7.2 6.0 - 8.3 g/dL   Albumin 4.7 3.5 - 5.2 g/dL   GFR 811.91 >47.82 mL/min   Calcium 9.2 8.4 - 10.5 mg/dL  Lipid panel  Result Value Ref Range   Cholesterol 189 0 - 200 mg/dL   Triglycerides 95.6 0.0 - 149.0 mg/dL   HDL 21.30 >86.57 mg/dL   VLDL 84.6 0.0 - 96.2 mg/dL   LDL Cholesterol 952 (H) 0 - 99 mg/dL   Total CHOL/HDL Ratio 3    NonHDL 121.49   TSH  Result Value Ref Range   TSH 1.77 0.35 - 4.50 uIU/mL  Hepatitis C antibody  Result Value Ref Range   Hepatitis C Ab NON-REACTIVE NON-REACTI   SIGNAL TO CUT-OFF 0.08 <1.00  VITAMIN D 25 Hydroxy (Vit-D Deficiency, Fractures)  Result Value Ref Range   VITD 43.14 30.00 - 100.00 ng/mL  Vitamin B12  Result Value Ref Range   Vitamin B-12 273 211 - 911 pg/mL    This visit occurred during the SARS-CoV-2 public health emergency.  Safety protocols were in place, including screening questions prior to the visit, additional usage of staff PPE, and extensive cleaning of exam room while observing appropriate contact time as indicated for disinfecting solutions.   COVID 19 screen:  No recent travel or known exposure to COVID19 The patient denies respiratory symptoms of COVID 19 at this time. The importance  of social distancing was discussed today.   Assessment and Plan Problem List Items Addressed This Visit     Acute migraine - Primary    Acute on chronic, poor control.  Moderate response to Relpax, antiemetic.  Normal neuro exam, no red flags.   Will treat with 60 mg IM of Toradol. Does not have a driver so she cannot get phenergan today in office.  Rest fluids, healthy lifestyle, continue Ajovy.          Kerby Nora, MD

## 2021-05-20 NOTE — Assessment & Plan Note (Addendum)
Acute on chronic, poor control.  Moderate response to Relpax, antiemetic.  Normal neuro exam, no red flags.   Will treat with 60 mg IM of Toradol. Does not have a driver so she cannot get phenergan today in office.  Rest fluids, healthy lifestyle, continue Ajovy.

## 2021-05-20 NOTE — Patient Instructions (Signed)
Continue Ajovy.  Call if migraine not resolving.

## 2021-05-20 NOTE — Addendum Note (Signed)
Addended by: Damita Lack on: 05/20/2021 12:22 PM   Modules accepted: Orders

## 2021-07-23 ENCOUNTER — Other Ambulatory Visit: Payer: Self-pay | Admitting: Family

## 2021-07-23 ENCOUNTER — Telehealth: Payer: Self-pay

## 2021-07-23 ENCOUNTER — Encounter: Payer: Self-pay | Admitting: Family

## 2021-07-23 ENCOUNTER — Other Ambulatory Visit: Payer: Self-pay

## 2021-07-23 ENCOUNTER — Ambulatory Visit: Payer: BC Managed Care – PPO | Admitting: Family

## 2021-07-23 VITALS — BP 124/80 | HR 77 | Temp 98.1°F | Ht 64.0 in | Wt 140.4 lb

## 2021-07-23 DIAGNOSIS — R053 Chronic cough: Secondary | ICD-10-CM | POA: Diagnosis not present

## 2021-07-23 MED ORDER — CHERATUSSIN AC 100-10 MG/5ML PO SYRP: 10.0000 mL | ORAL_SOLUTION | Freq: Every evening | ORAL | 0 refills | Status: DC | PRN

## 2021-07-23 MED ORDER — METHYLPREDNISOLONE ACETATE 80 MG/ML IJ SUSP
80.0000 mg | Freq: Once | INTRAMUSCULAR | Status: AC
  Administered 2021-07-23: 11:00:00 80 mg via INTRAMUSCULAR

## 2021-07-23 NOTE — Assessment & Plan Note (Signed)
sending cheratussin cough syrup, advised on use & SE. Continue OTC sinus/cold medications including generic Sudafed, Claritin D, or Mucinex per box directions, Ibuprofen up to 600mg  or generic Tylenol 1,000mg  every 6 hours. Increase water intake to at least 2 liters daily.

## 2021-07-23 NOTE — Telephone Encounter (Signed)
unfortunately, unable to send electronically again, she will need to pick up the printed RX.

## 2021-07-23 NOTE — Patient Instructions (Signed)
It was very nice to see you today!  I have sent the Cheratussin cough syrup to your pharmacy. Continue over the counter sinus/cold medications including generic Sudafed, Claritin D, or Mucinex per box directions, Ibuprofen up to 600mg  or generic Tylenol 1,000mg  every 6 hours. OK to take Zinc up to 25mg  & up to 2,000mg  Vitamin C daily while having symptoms, then reduce doses to 3-4 days per week, or 1/2 dose daily to boost immunity.  Increase water intake to at least 2 liters daily.     PLEASE NOTE:  If you had any lab tests please let know if you have not heard back within a few days. You may see your results on MyChart before we have a chance to review them but we will give you a call once they are reviewed by . If we ordered any referrals today, please let us know if you have not heard from their office within the next week.   Please try these tips to maintain a healthy lifestyle:  Eat most of your calories during the day when you are active. Eliminate processed foods including packaged sweets (pies, cakes, cookies), reduce intake of potatoes, white bread, white pasta, and white rice. Look for whole grain options, oat flour or almond flour.  Each meal should contain half fruits/vegetables, one quarter protein, and one quarter carbs (no bigger than a computer mouse).  Cut down on sweet beverages. This includes juice, soda, and sweet tea. Also watch fruit intake, though this is a healthier sweet option, it still contains natural sugar! Limit to 3 servings daily.  Drink at least 1 glass of water with each meal and aim for at least 8 glasses per day  Exercise at least 150 minutes every week.

## 2021-07-23 NOTE — Progress Notes (Signed)
Subjective:     Patient ID: Sheri Wilson, female    DOB: 15-Dec-1995, 25 y.o.   MRN: [id number]  Chief Complaint  Patient presents with   Cough    Started last Tuesday with Sore throat. Wednesday symptoms progressed into a cough. Sore throat subsided. She has taken OTC Muccinex and Delsym, but still has a persistent cough. Negative Covid Test last Saturday.    Hoarse    HPI: Upper Respiratory Infection: Symptoms include no  fever, non productive cough, and post nasal drip.  Onset of symptoms was 7 days ago, unchanged since that time. She is drinking plenty of fluids. Evaluation to date: none.  Treatment to date: none.    Health Maintenance Due  Topic Date Due   INFLUENZA VACCINE  03/03/2021    Past Medical History:  Diagnosis Date   Asthma    Migraines     Past Surgical History:  Procedure Laterality Date   ARTHRODESIS FOOT WITH ILIAC CREST BONE GRAFT  2011   bone graft L foot sesamoid bone   BREAST SURGERY  07/2018   right breast biopsy   fai repair Right 2016   FAI repair right hip   FAI repair Right 2018   FAI repair/labrum repair right hip   SESAMOIDECTOMY Left 2013   SHOULDER ARTHROSCOPY W/ SUPERIOR LABRAL ANTERIOR POSTERIOR LESION REPAIR Right 2015   SHOULDER ARTHROSCOPY W/ SUPERIOR LABRAL ANTERIOR POSTERIOR REPAIR Right 2014    Outpatient Medications Prior to Visit  Medication Sig Dispense Refill   Cholecalciferol (VITAMIN D) 50 MCG (2000 UT) CAPS Take 1 capsule by mouth daily.     eletriptan (RELPAX) 20 MG tablet TAKE 1 TABLET BY MOUTH AS NEEDED FOR MIGRAINE OR HEADACHE. MAY REPEAT IN 2 HOURS IF HEADACHE PERSISTS OR RECURS. 10 tablet 2   Fremanezumab-vfrm (AJOVY) 225 MG/1.5ML SOAJ Inject 225 mg into the skin every 30 (thirty) days. 4.5 mL 2   No facility-administered medications prior to visit.    Allergies  Allergen Reactions   Indomethacin Other (See Comments)    Vision impairment   Sulfa Antibiotics Hives        Objective:    Physical  Exam Vitals and nursing note reviewed.  Constitutional:      Appearance: Normal appearance.  HENT:     Right Ear: Tympanic membrane and ear canal normal.     Left Ear: Tympanic membrane and ear canal normal.     Nose:     Right Sinus: No frontal sinus tenderness.     Left Sinus: No frontal sinus tenderness.     Mouth/Throat:     Mouth: Mucous membranes are moist.     Pharynx: Posterior oropharyngeal erythema present. No pharyngeal swelling or oropharyngeal exudate.  Cardiovascular:     Rate and Rhythm: Normal rate and regular rhythm.  Pulmonary:     Effort: Pulmonary effort is normal.     Breath sounds: Normal breath sounds.  Musculoskeletal:        General: Normal range of motion.  Skin:    General: Skin is warm and dry.  Neurological:     Mental Status: She is alert.  Psychiatric:        Mood and Affect: Mood normal.        Behavior: Behavior normal.    BP 124/80    Pulse 77    Temp 98.1 F (36.7 C) (Temporal)    Ht 5\' 4"  (1.626 m)    Wt 140 lb 6.4 oz (63.7 kg)  SpO2 98%    BMI 24.10 kg/m  Wt Readings from Last 3 Encounters:  07/23/21 140 lb 6.4 oz (63.7 kg)  05/20/21 143 lb 4 oz (65 kg)  04/29/21 135 lb (61.2 kg)       Assessment & Plan:   Problem List Items Addressed This Visit       Other   Persistent cough - Primary    sending cheratussin cough syrup, advised on use & SE. Continue OTC sinus/cold medications including generic Sudafed, Claritin D, or Mucinex per box directions, Ibuprofen up to 600mg  or generic Tylenol 1,000mg  every 6 hours. Increase water intake to at least 2 liters daily.        Relevant Medications   methylPREDNISolone acetate (DEPO-MEDROL) injection 80 mg   guaiFENesin-codeine (CHERATUSSIN AC) 100-10 MG/5ML syrup    Meds ordered this encounter  Medications   methylPREDNISolone acetate (DEPO-MEDROL) injection 80 mg   guaiFENesin-codeine (CHERATUSSIN AC) 100-10 MG/5ML syrup    Sig: Take 10 mLs by mouth at bedtime as needed for cough  or congestion.    Dispense:  118 mL    Refill:  0    Order Specific Question:   Supervising Provider    Answer:   ANDY, CAMILLE L [[id number]]

## 2021-07-23 NOTE — Telephone Encounter (Signed)
Patient states her pharmacy is out of cheratussin.  Would like script sent to [facility] at [address]

## 2021-07-24 NOTE — Telephone Encounter (Signed)
Pt made aware. Px has been placed at the front desk.

## 2021-08-05 ENCOUNTER — Ambulatory Visit: Payer: BC Managed Care – PPO | Admitting: Physician Assistant

## 2021-08-05 ENCOUNTER — Encounter: Payer: Self-pay | Admitting: Physician Assistant

## 2021-08-05 ENCOUNTER — Other Ambulatory Visit: Payer: Self-pay

## 2021-08-05 VITALS — BP 114/79 | HR 65 | Temp 97.8°F | Ht 64.0 in | Wt 141.4 lb

## 2021-08-05 DIAGNOSIS — Z0000 Encounter for general adult medical examination without abnormal findings: Secondary | ICD-10-CM | POA: Diagnosis not present

## 2021-08-05 DIAGNOSIS — G43009 Migraine without aura, not intractable, without status migrainosus: Secondary | ICD-10-CM

## 2021-08-05 DIAGNOSIS — Z23 Encounter for immunization: Secondary | ICD-10-CM | POA: Diagnosis not present

## 2021-08-05 NOTE — Progress Notes (Signed)
Subjective:    Patient ID: Sheri Wilson, female    DOB: Apr 06, 1996, 26 y.o.   MRN: [id number]  Chief Complaint  Patient presents with   Annual Exam    HPI  26 yo female patient presents for [facility] and annual exam.   Acute concerns: No health concerns. No specialty doctors.   Chronic concerns: Migraines - since about elementary school age. Recently had relapse when she was w/o Ajovy for about 6 weeks, doing better now with regular regular of Ajovy and Relpax as needed (usually 1-2 migraines per month).  Health maintenance: Lifestyle/ exercise: HIIT, weight-lifting, doing very well  Nutrition: Cooks daily  Mental health: Doing well, no issues. Married and has 2 dogs she enjoys.  Caffeine: Cup of coffee in the mornings; no sodas  Sleep: Doing well, no concerns  Substance use: None Sexual activity: Monogamous with husband  Pap: UTD     Past Medical History:  Diagnosis Date   Asthma    Migraines     Past Surgical History:  Procedure Laterality Date   ARTHRODESIS FOOT WITH ILIAC CREST BONE GRAFT  2011   bone graft L foot sesamoid bone   BREAST SURGERY  07/2018   right breast biopsy   fai repair Right 2016   FAI repair right hip   FAI repair Right 2018   FAI repair/labrum repair right hip   SESAMOIDECTOMY Left 2013   SHOULDER ARTHROSCOPY W/ SUPERIOR LABRAL ANTERIOR POSTERIOR LESION REPAIR Right 2015   SHOULDER ARTHROSCOPY W/ SUPERIOR LABRAL ANTERIOR POSTERIOR REPAIR Right 2014    Family History  Problem Relation Age of Onset   Colon cancer Paternal Grandfather    Cancer Neg Hx    Diabetes Neg Hx    Stroke Neg Hx    Inflammatory bowel disease Neg Hx     Social History   Tobacco Use   Smoking status: Never   Smokeless tobacco: Never  Vaping Use   Vaping Use: Never used  Substance Use Topics   Alcohol use: Yes    Comment: socially   Drug use: Never     Allergies  Allergen Reactions   Indomethacin Other (See Comments)    Vision impairment   Sulfa  Antibiotics Hives   Topamax [Topiramate]     Numbness in hands and feet; brain fog / memory issues     Review of Systems NEGATIVE UNLESS OTHERWISE INDICATED IN HPI      Objective:     BP 114/79    Pulse 65    Temp 97.8 F (36.6 C)    Ht 5\' 4"  (1.626 m)    Wt 141 lb 6.1 oz (64.1 kg)    LMP 07/19/2021    SpO2 98%    BMI 24.27 kg/m   Wt Readings from Last 3 Encounters:  08/05/21 141 lb 6.1 oz (64.1 kg)  07/23/21 140 lb 6.4 oz (63.7 kg)  05/20/21 143 lb 4 oz (65 kg)    BP Readings from Last 3 Encounters:  08/05/21 114/79  07/23/21 124/80  05/20/21 90/60     Physical Exam Vitals and nursing note reviewed.  Constitutional:      Appearance: Normal appearance. She is normal weight. She is not toxic-appearing.  HENT:     Head: Normocephalic and atraumatic.     Right Ear: Tympanic membrane, ear canal and external ear normal.     Left Ear: Tympanic membrane, ear canal and external ear normal.     Nose: Nose normal.  Mouth/Throat:     Mouth: Mucous membranes are moist.  Eyes:     Extraocular Movements: Extraocular movements intact.     Conjunctiva/sclera: Conjunctivae normal.     Pupils: Pupils are equal, round, and reactive to light.  Cardiovascular:     Rate and Rhythm: Normal rate and regular rhythm.     Pulses: Normal pulses.     Heart sounds: Normal heart sounds.  Pulmonary:     Effort: Pulmonary effort is normal.     Breath sounds: Normal breath sounds.  Abdominal:     General: Abdomen is flat. Bowel sounds are normal.     Palpations: Abdomen is soft.  Musculoskeletal:        General: Normal range of motion.     Cervical back: Normal range of motion and neck supple.     Right lower leg: No edema.     Left lower leg: No edema.  Skin:    General: Skin is warm and dry.  Neurological:     General: No focal deficit present.     Mental Status: She is alert and oriented to person, place, and time.  Psychiatric:        Mood and Affect: Mood normal.         Behavior: Behavior normal.        Thought Content: Thought content normal.        Judgment: Judgment normal.       Assessment & Plan:   Problem List Items Addressed This Visit       Cardiovascular and Mediastinum   Migraine headache without aura   Other Visit Diagnoses     Annual physical exam    -  Primary   Need for immunization against influenza       Relevant Orders   Flu Vaccine QUAD 39mo+IM (Fluarix, Fluzone & Alfiuria Quad PF) (Completed)       1. Annual physical exam Age-appropriate screening and counseling performed today. Labs in Dec 2021 all normal with full panel, with no concerns today, elected together to wait on checking labs again at this time. Preventive measures discussed and printed in AVS for patient. Doing very well with lifestyle.  2. Migraine without aura and without status migrainosus, not intractable Stable on Ajovy 225 mg / month and Relpax 20 mg prn. She will call for refills. She will call if any concerns.   3. Need for immunization against influenza Vaccine updated.     Josias Tomerlin M Harrold Fitchett, PA-C

## 2021-08-05 NOTE — Patient Instructions (Signed)
Very good to meet you today! Keep up the good work taking care of your health. Call for refills as needed. See you back in 1 year or sooner if any concerns.   Flu shot in office today.

## 2021-09-06 ENCOUNTER — Encounter: Payer: Self-pay | Admitting: Emergency Medicine

## 2021-09-06 ENCOUNTER — Other Ambulatory Visit: Payer: Self-pay

## 2021-09-06 ENCOUNTER — Ambulatory Visit
Admission: EM | Admit: 2021-09-06 | Discharge: 2021-09-06 | Disposition: A | Discharge status: Home / Self Care | Payer: BC Managed Care – PPO | Attending: Urgent Care | Admitting: Urgent Care

## 2021-09-06 DIAGNOSIS — L255 Unspecified contact dermatitis due to plants, except food: Secondary | ICD-10-CM | POA: Diagnosis not present

## 2021-09-06 DIAGNOSIS — L299 Pruritus, unspecified: Secondary | ICD-10-CM

## 2021-09-06 MED ORDER — PREDNISONE 20 MG PO TABS: ORAL_TABLET | ORAL | 0 refills | Status: DC

## 2021-09-06 MED ORDER — HYDROXYZINE HCL 25 MG PO TABS: 12.5000 mg | ORAL_TABLET | Freq: Three times a day (TID) | ORAL | 0 refills | Status: DC | PRN

## 2021-09-06 NOTE — ED Triage Notes (Signed)
States she has poision ivy.  Recent break out 3 weeks ago.  Rash on face and right arm, and chest area, and legs.  States rash itches.  Noticed rash on face last night.

## 2021-09-06 NOTE — ED Provider Notes (Signed)
[facility]   MRN: [id number] DOB: [birth year]  Subjective:   Sheri Wilson is a 26 y.o. female presenting for recurrent itchy rash.  Patient states that 3 weeks ago her and her husband had to cut down a tree that had a lot of dead vines on.  Unfortunately yesterday she came into contact with some of this debris that she was unaware that her husband had placed it [place].  She is very allergic to poison ivy and has had difficulty with it in the winter from her dogs digging through that patches.  This episode is associated from the tree they cut down.  Denies any facial or oral swelling but has had this happen before.  No chest tightness, nausea, vomiting, abdominal pain.  No current facility-administered medications for this encounter.  Current Outpatient Medications:    Cholecalciferol (VITAMIN D) 50 MCG (2000 UT) CAPS, Take 1 capsule by mouth daily., Disp: , Rfl:    eletriptan (RELPAX) 20 MG tablet, TAKE 1 TABLET BY MOUTH AS NEEDED FOR MIGRAINE OR HEADACHE. MAY REPEAT IN 2 HOURS IF HEADACHE PERSISTS OR RECURS., Disp: 10 tablet, Rfl: 2   Fremanezumab-vfrm (AJOVY) 225 MG/1.5ML SOAJ, Inject 225 mg into the skin every 30 (thirty) days., Disp: 4.5 mL, Rfl: 2   Allergies  Allergen Reactions   Indomethacin Other (See Comments)    Vision impairment   Sulfa Antibiotics Hives   Topamax [Topiramate]     Numbness in hands and feet; brain fog / memory issues     Past Medical History:  Diagnosis Date   Asthma    Migraines      Past Surgical History:  Procedure Laterality Date   ARTHRODESIS FOOT WITH ILIAC CREST BONE GRAFT  2011   bone graft L foot sesamoid bone   BREAST SURGERY  07/2018   right breast biopsy   fai repair Right 2016   FAI repair right hip   FAI repair Right 2018   FAI repair/labrum repair right hip   SESAMOIDECTOMY Left 2013   SHOULDER ARTHROSCOPY W/ SUPERIOR LABRAL ANTERIOR POSTERIOR LESION REPAIR Right 2015   SHOULDER ARTHROSCOPY W/  SUPERIOR LABRAL ANTERIOR POSTERIOR REPAIR Right 2014    Family History  Problem Relation Age of Onset   Colon cancer Paternal Grandfather    Cancer Neg Hx    Diabetes Neg Hx    Stroke Neg Hx    Inflammatory bowel disease Neg Hx     Social History   Tobacco Use   Smoking status: Never   Smokeless tobacco: Never  Vaping Use   Vaping Use: Never used  Substance Use Topics   Alcohol use: Yes    Comment: socially   Drug use: Never    ROS   Objective:   Vitals: BP 136/80 (BP Location: Right Arm)    Pulse 82    Temp 98.4 F (36.9 C) (Oral)    Resp 18    LMP 08/18/2021 (Exact Date)    SpO2 99%   Physical Exam Constitutional:      General: She is not in acute distress.    Appearance: Normal appearance. She is well-developed. She is not ill-appearing, toxic-appearing or diaphoretic.  HENT:     Head: Normocephalic and atraumatic.     Nose: Nose normal.     Mouth/Throat:     Mouth: Mucous membranes are moist.     Comments: Airway is patent, no facial or oral swelling. Eyes:     General: No scleral icterus.  Right eye: No discharge.        Left eye: No discharge.     Extraocular Movements: Extraocular movements intact.  Cardiovascular:     Rate and Rhythm: Normal rate.  Pulmonary:     Effort: Pulmonary effort is normal.  Skin:    General: Skin is warm and dry.     Findings: Rash (multiple linear patches of erythema with associated excoriations over the left side of her face, underside of her chin, forearms bilaterally, left torso, bilateral thighs and lower legs) present.  Neurological:     General: No focal deficit present.     Mental Status: She is alert and oriented to person, place, and time.  Psychiatric:        Mood and Affect: Mood normal.        Behavior: Behavior normal.        Thought Content: Thought content normal.        Judgment: Judgment normal.    Assessment and Plan :   PDMP not reviewed this encounter.  1. Rhus dermatitis   2. Itching     We will use a 15-day steroid course to address Rhus dermatitis.  Use Vistaril for severe itching. Counseled patient on potential for adverse effects with medications prescribed/recommended today, ER and return-to-clinic precautions discussed, patient verbalized understanding.    Wallis Bamberg, [place] 09/06/21 [phone]

## 2022-01-29 ENCOUNTER — Other Ambulatory Visit: Payer: Self-pay | Admitting: Physician Assistant

## 2022-04-24 ENCOUNTER — Ambulatory Visit: Payer: BC Managed Care – PPO | Admitting: Physician Assistant

## 2022-04-24 ENCOUNTER — Encounter: Payer: Self-pay | Admitting: Physician Assistant

## 2022-04-24 VITALS — BP 120/80 | HR 85 | Temp 97.5°F | Ht 64.0 in | Wt 137.8 lb

## 2022-04-24 DIAGNOSIS — Z23 Encounter for immunization: Secondary | ICD-10-CM

## 2022-04-24 DIAGNOSIS — G43009 Migraine without aura, not intractable, without status migrainosus: Secondary | ICD-10-CM

## 2022-04-24 DIAGNOSIS — Z789 Other specified health status: Secondary | ICD-10-CM | POA: Diagnosis not present

## 2022-04-24 NOTE — Progress Notes (Unsigned)
Subjective:    Patient ID: Sheri Wilson, female    DOB: August 18, 1995, 26 y.o.   MRN: [id number]  Chief Complaint  Patient presents with   Migraine    Pt came in to discuss treatment options for migraines since trying to get pregnant in the near future. Pt is unable to continue with injections. Pt says when not on a preventative medication she has migraines 2-3 times weekly, last migraine was 04/11/22 that lasted a few hours before medication started working.     HPI Patient is in today for migraines discussion. Aimovig x 1 year, then insurance stopped covering, switched to Ajovy, which helps migraines tremendously. Usually still having 1-2 per month. Otherwise at least twice per week. Migraines started in elementary school, worse in high school. Not related to food or cycles. Has not seen a specialist. Mom & sister had migraines.  Not on OCPs. Vision is UTD.   Wanting to try to conceive soon.   Relpax works for acute abortive therapy.  Topamax - numbness / tingling / bad brain fog; didn't help migraines at all. Imitrex didn't help for acute relief. Has not tried magnesium. Never been on amitriptyline.   Migraines - behind eyes. Vomiting. Light / noise sensitivity. Has to miss work when she has one. Sometimes will last for days - then has to get migraine cocktail from ED.   Past Medical History:  Diagnosis Date   Asthma    Migraines     Past Surgical History:  Procedure Laterality Date   ARTHRODESIS FOOT WITH ILIAC CREST BONE GRAFT  2011   bone graft L foot sesamoid bone   BREAST SURGERY  07/2018   right breast biopsy   fai repair Right 2016   FAI repair right hip   FAI repair Right 2018   FAI repair/labrum repair right hip   SESAMOIDECTOMY Left 2013   SHOULDER ARTHROSCOPY W/ SUPERIOR LABRAL ANTERIOR POSTERIOR LESION REPAIR Right 2015   SHOULDER ARTHROSCOPY W/ SUPERIOR LABRAL ANTERIOR POSTERIOR REPAIR Right 2014    Family History  Problem Relation Age of Onset   Colon  cancer Paternal Grandfather    Cancer Neg Hx    Diabetes Neg Hx    Stroke Neg Hx    Inflammatory bowel disease Neg Hx     Social History   Tobacco Use   Smoking status: Never   Smokeless tobacco: Never  Vaping Use   Vaping Use: Never used  Substance Use Topics   Alcohol use: Yes    Comment: socially   Drug use: Never     Allergies  Allergen Reactions   Indomethacin Other (See Comments)    Vision impairment   Sulfa Antibiotics Hives   Topamax [Topiramate]     Numbness in hands and feet; brain fog / memory issues     Review of Systems NEGATIVE UNLESS OTHERWISE INDICATED IN HPI      Objective:     BP 120/80 (BP Location: Left Arm)   Pulse 85   Temp (!) 97.5 F (36.4 C) (Temporal)   Ht 5\' 4"  (1.626 m)   Wt 137 lb 12.8 oz (62.5 kg)   LMP 04/04/2022 (Exact Date)   SpO2 98%   BMI 23.65 kg/m   Wt Readings from Last 3 Encounters:  04/24/22 137 lb 12.8 oz (62.5 kg)  08/05/21 141 lb 6.1 oz (64.1 kg)  07/23/21 140 lb 6.4 oz (63.7 kg)    BP Readings from Last 3 Encounters:  04/24/22 120/80  09/06/21 136/80  08/05/21 114/79     Physical Exam Vitals and nursing note reviewed.  Constitutional:      Appearance: Normal appearance. She is normal weight. She is not toxic-appearing.  HENT:     Head: Normocephalic and atraumatic.     Right Ear: Tympanic membrane, ear canal and external ear normal.     Left Ear: Tympanic membrane, ear canal and external ear normal.     Nose: Nose normal.     Mouth/Throat:     Mouth: Mucous membranes are moist.  Eyes:     Extraocular Movements: Extraocular movements intact.     Conjunctiva/sclera: Conjunctivae normal.     Pupils: Pupils are equal, round, and reactive to light.  Cardiovascular:     Rate and Rhythm: Normal rate and regular rhythm.     Pulses: Normal pulses.     Heart sounds: Normal heart sounds.  Pulmonary:     Effort: Pulmonary effort is normal.     Breath sounds: Normal breath sounds.  Abdominal:      General: Abdomen is flat. Bowel sounds are normal.     Palpations: Abdomen is soft.  Musculoskeletal:        General: Normal range of motion.     Cervical back: Normal range of motion and neck supple.  Skin:    General: Skin is warm and dry.  Neurological:     General: No focal deficit present.     Mental Status: She is alert and oriented to person, place, and time.     Cranial Nerves: No cranial nerve deficit.     Motor: No weakness.     Gait: Gait normal.  Psychiatric:        Mood and Affect: Mood normal.        Behavior: Behavior normal.        Thought Content: Thought content normal.        Judgment: Judgment normal.        Assessment & Plan:  Migraine without aura and without status migrainosus, not intractable -     Ambulatory referral to Neurology  Attempting to conceive -     Ambulatory referral to Neurology  Need for immunization against influenza -     Flu Vaccine QUAD 16mo+IM (Fluarix, Fluzone & Alfiuria Quad PF)     Pleasant 26 year old female desiring to attempt conception soon.  She has a history of chronic migraines, which have been well controlled on Ajovy.  Unfortunately, she cannot take this medication while pregnant.  I strongly recommend consultation with neurology to discuss coming off of this medication and how to best manage her migraines moving forward.   Return if symptoms worsen or fail to improve.  This note was prepared with assistance of Conservation officer, historic buildings. Occasional wrong-word or sound-a-like substitutions may have occurred due to the inherent limitations of voice recognition software.  Time Spent: 20 minutes of total time was spent on the date of the encounter performing the following actions: chart review prior to seeing the patient, obtaining history, performing a medically necessary exam, counseling on the treatment plan, placing orders, and documenting in our EHR.       Arael Piccione M Dianelys Scinto, PA-C

## 2022-05-05 NOTE — Progress Notes (Unsigned)
NEUROLOGY CONSULTATION NOTE  Sheri Wilson MRN: [id number] DOB: Dec 22, 1995  Referring provider: Theresa Duty, PA-C Primary care provider: Theresa Duty, PA-C  Reason for consult:  migraines  Assessment/Plan:   Migraine without aura, without status migrainosus, not intractable  Recommend discontinuing Ajovy about 5-6 months prior to starting to try and conceive In addition to magnesium oxide 400mg  daily, may start riboflavin 400mg  daily Ideally, we recommend not to be on a prescription medication while trying to conceive.  If you feel migraines increase after discontinuing Ajovy and a preventative is needed, consider starting amitriptyline or nortriptyline (pending approval from GYN) until she becomes pregnant. Follow up 6 months.   Subjective:  Sheri Wilson is a 26 year old female with asthma and migraines who presents for migraines.  History supplemented by referring provider's note.  Onset:  childhood.  Became more frequent beginning in high school.  Location:  across forehead and behind eyes.  Quality:  pressure, throbbing.  Intensity:  sevee.  Aura:  absent.  Prodrome:  absent.  Associated symptoms:  Nausea, vomiting, photophobia, phonophobia.  She denies associated visual disturbance, unilateral numbness or weakness.  Duration:  eases up within hour with eletriptan, otherwise repeat dose and lasts a couple of more hours.  May be intractable on a couple of occasions since starting the CGRP inhibitor injections.  Frequency:  Prior to starting Ajovy, they occurred 1-2 a week; Since on Ajovy, 1-2 a month.  Frequency of abortive medication: 1-2 a month.  Triggers:  stress aggravates them.  Relieving factors:  weighted cold eye mask, rest in dark and quiet room.  Activity:  activity aggravates migraines and vice versa  She would now like to start trying to conceive and would like to discuss treatment options.  Past NSAIDS/analgesics:  ibuprofen, Tylenol, Excedrin,  Fioricet Past abortive triptans:  sumatriptan tab (caused stiffness of neck and arms) Past abortive ergotamine:  none Past muscle relaxants:  baclofen, tizanidine Past anti-emetic:  promethazine 12.5mg  Past antihypertensive medications:  none Past antidepressant medications:  Lexapro (night sweats) Past anticonvulsant medications:  topiramate (side effects) Past anti-CGRP:  Aimovig 70mg  (no longer covered by insurance) Past vitamins/Herbal/Supplements:  none Past antihistamines/decongestants:  Flonase Other past therapies:  none  Current NSAIDS/analgesics:  none Current triptans:  eletriptan 20mg  Current ergotamine:  none Current anti-emetic:  none Current muscle relaxants:  none Current Antihypertensive medications:  none Current Antidepressant medications:  none Current Anticonvulsant medications:  none Current anti-CGRP:  Ajovy Current Vitamins/Herbal/Supplements:  magnesium oxide 400mg , MVI, D Current Antihistamines/Decongestants:  none Other therapy:  none Hormone/birth control:  none   Caffeine:  4 oz coffee daily, no tea or soda Diet:  aim for 90 oz water daily, does not skip meals Exercise:  routine Depression:  no; Anxiety:  no Other pain:  no Sleep hygiene:  good Family history of headache:  mother (migraines), sister (migraines)      PAST MEDICAL HISTORY: Past Medical History:  Diagnosis Date   Asthma    Migraines     PAST SURGICAL HISTORY: Past Surgical History:  Procedure Laterality Date   ARTHRODESIS FOOT WITH ILIAC CREST BONE GRAFT  2011   bone graft L foot sesamoid bone   BREAST SURGERY  07/2018   right breast biopsy   fai repair Right 2016   FAI repair right hip   FAI repair Right 2018   FAI repair/labrum repair right hip   SESAMOIDECTOMY Left 2013   SHOULDER ARTHROSCOPY W/ SUPERIOR LABRAL ANTERIOR POSTERIOR LESION REPAIR Right 2015  SHOULDER ARTHROSCOPY W/ SUPERIOR LABRAL ANTERIOR POSTERIOR REPAIR Right 2014    MEDICATIONS: Current  Outpatient Medications on File Prior to Visit  Medication Sig Dispense Refill   AJOVY 225 MG/1.5ML SOAJ INJECT 225 MG INTO THE SKIN EVERY 30 DAYS 4.5 mL 2   Cholecalciferol (VITAMIN D) 50 MCG (2000 UT) CAPS Take 1 capsule by mouth daily.     eletriptan (RELPAX) 20 MG tablet TAKE 1 TABLET BY MOUTH AS NEEDED FOR MIGRAINE OR HEADACHE. MAY REPEAT IN 2 HOURS IF HEADACHE PERSISTS OR RECURS. 10 tablet 2   No current facility-administered medications on file prior to visit.    ALLERGIES: Allergies  Allergen Reactions   Indomethacin Other (See Comments)    Vision impairment   Sulfa Antibiotics Hives   Topamax [Topiramate]     Numbness in hands and feet; brain fog / memory issues     FAMILY HISTORY: Family History  Problem Relation Age of Onset   Colon cancer Paternal Grandfather    Cancer Neg Hx    Diabetes Neg Hx    Stroke Neg Hx    Inflammatory bowel disease Neg Hx     Objective:  Blood pressure 118/77, pulse 76, height 5\' 4"  (1.626 m), weight 139 lb (63 kg), last menstrual period 04/04/2022, SpO2 100 %. General: No acute distress.  Patient appears well-groomed.   Head:  Normocephalic/atraumatic Eyes:  fundi examined but not visualized Neck: supple, no paraspinal tenderness, full range of motion Heart: regular rate and rhythm Neurological Exam: Mental status: alert and oriented to person, place, and time, speech fluent and not dysarthric, language intact. Cranial nerves: CN I: not tested CN II: pupils equal, round and reactive to light, visual fields intact CN III, IV, VI:  full range of motion, no nystagmus, no ptosis CN V: facial sensation intact. CN VII: upper and lower face symmetric CN VIII: hearing intact CN IX, X: gag intact, uvula midline CN XI: sternocleidomastoid and trapezius muscles intact CN XII: tongue midline Bulk & Tone: normal, no fasciculations. Motor:  muscle strength 5/5 throughout Sensation:  Pinprick, temperature and vibratory sensation intact. Deep  Tendon Reflexes:  2+ throughout,  toes downgoing.   Finger to nose testing:  Without dysmetria.   Heel to shin:  Without dysmetria.   Gait:  Normal station and stride.  Romberg negative.    Thank you for allowing me to take part in the care of this patient.  Metta Clines, DO  CC: Tilda Burrow, PA-C

## 2022-05-07 ENCOUNTER — Encounter: Payer: Self-pay | Admitting: Neurology

## 2022-05-07 ENCOUNTER — Ambulatory Visit: Payer: BC Managed Care – PPO | Admitting: Neurology

## 2022-05-07 VITALS — BP 118/77 | HR 76 | Ht 64.0 in | Wt 139.0 lb

## 2022-05-07 DIAGNOSIS — G43009 Migraine without aura, not intractable, without status migrainosus: Secondary | ICD-10-CM | POA: Diagnosis not present

## 2022-05-07 NOTE — Patient Instructions (Signed)
I would stop Ajovy about 5-6 months prior to starting trying to get pregnant 2.   In addition to magnesium oxide 400mg  daily, may take riboflavin (vit B2) 400mg  daily 3.   If another preventative medication needs to be started in the interim of stopping Ajovy and becoming pregnant, consider amitriptyline or nortriptyline (run it by your GYN first) 4.  Follow up in 6 months.

## 2022-07-07 ENCOUNTER — Encounter: Payer: Self-pay | Admitting: Neurology

## 2022-08-03 NOTE — L&D Delivery Note (Signed)
Delivery Note  SVD viable female "Sherilyn Cooter"  Apgars 9,9 over 2nd degree ML lac.  Placenta delivered spontaneously intact with 3VC. Repair with 2-0 with good support and hemostasis noted.  R/V exam confirms. .   Mother and baby to couplet care and are doing well.  EBL 132 cc  Candice Camp, MD

## 2022-10-06 ENCOUNTER — Ambulatory Visit: Payer: BC Managed Care – PPO | Admitting: Neurology

## 2022-10-07 LAB — OB RESULTS CONSOLE HIV ANTIBODY (ROUTINE TESTING): HIV: NONREACTIVE

## 2022-10-07 LAB — HM HEPATITIS C SCREENING LAB: HM Hepatitis Screen: NEGATIVE

## 2022-10-07 LAB — OB RESULTS CONSOLE RUBELLA ANTIBODY, IGM: Rubella: IMMUNE

## 2022-10-07 LAB — OB RESULTS CONSOLE RPR: RPR: NONREACTIVE

## 2022-10-07 LAB — OB RESULTS CONSOLE GC/CHLAMYDIA: Chlamydia: NEGATIVE

## 2022-10-07 LAB — OB RESULTS CONSOLE HEPATITIS B SURFACE ANTIGEN: Hepatitis B Surface Ag: NEGATIVE

## 2022-10-07 LAB — OB RESULTS CONSOLE ANTIBODY SCREEN: Antibody Screen: NEGATIVE

## 2022-10-07 LAB — HEPATITIS C ANTIBODY: HCV Ab: NEGATIVE

## 2022-10-12 LAB — OB RESULTS CONSOLE ABO/RH: RH Type: NEGATIVE

## 2022-10-16 LAB — OB RESULTS CONSOLE GC/CHLAMYDIA
Chlamydia: NEGATIVE
Neisseria Gonorrhea: NEGATIVE

## 2022-11-06 ENCOUNTER — Ambulatory Visit: Payer: BC Managed Care – PPO | Admitting: Neurology

## 2023-01-11 ENCOUNTER — Encounter (HOSPITAL_COMMUNITY): Payer: Self-pay | Admitting: *Deleted

## 2023-01-11 ENCOUNTER — Other Ambulatory Visit: Payer: Self-pay

## 2023-01-11 ENCOUNTER — Inpatient Hospital Stay (HOSPITAL_COMMUNITY)
Admission: AD | Admit: 2023-01-11 | Discharge: 2023-01-11 | Disposition: A | Discharge status: Home / Self Care | Payer: BC Managed Care – PPO | Attending: Obstetrics and Gynecology | Admitting: Obstetrics and Gynecology

## 2023-01-11 ENCOUNTER — Inpatient Hospital Stay (HOSPITAL_COMMUNITY): Payer: BC Managed Care – PPO

## 2023-01-11 DIAGNOSIS — Z3A22 22 weeks gestation of pregnancy: Secondary | ICD-10-CM

## 2023-01-11 DIAGNOSIS — O26832 Pregnancy related renal disease, second trimester: Secondary | ICD-10-CM | POA: Insufficient documentation

## 2023-01-11 DIAGNOSIS — O26892 Other specified pregnancy related conditions, second trimester: Secondary | ICD-10-CM | POA: Diagnosis present

## 2023-01-11 DIAGNOSIS — Z3492 Encounter for supervision of normal pregnancy, unspecified, second trimester: Secondary | ICD-10-CM

## 2023-01-11 DIAGNOSIS — R109 Unspecified abdominal pain: Secondary | ICD-10-CM

## 2023-01-11 DIAGNOSIS — N200 Calculus of kidney: Secondary | ICD-10-CM | POA: Insufficient documentation

## 2023-01-11 LAB — URINALYSIS, ROUTINE W REFLEX MICROSCOPIC
Bilirubin Urine: NEGATIVE
Glucose, UA: NEGATIVE mg/dL
Hgb urine dipstick: NEGATIVE
Ketones, ur: 20 mg/dL — AB
Leukocytes,Ua: NEGATIVE
Nitrite: NEGATIVE
Protein, ur: NEGATIVE mg/dL
Specific Gravity, Urine: 1.006 (ref 1.005–1.030)
pH: 6 (ref 5.0–8.0)

## 2023-01-11 MED ORDER — TAMSULOSIN HCL 0.4 MG PO CAPS: 0.4000 mg | ORAL_CAPSULE | Freq: Every day | ORAL | 2 refills | Status: DC

## 2023-01-11 MED ORDER — ONDANSETRON 4 MG PO TBDP
8.0000 mg | ORAL_TABLET | Freq: Once | ORAL | Status: AC
  Administered 2023-01-11: 8 mg via ORAL
  Filled 2023-01-11: qty 2

## 2023-01-11 MED ORDER — OXYCODONE HCL 5 MG PO TABS
10.0000 mg | ORAL_TABLET | Freq: Once | ORAL | Status: AC
  Administered 2023-01-11: 10 mg via ORAL
  Filled 2023-01-11: qty 2

## 2023-01-11 MED ORDER — ONDANSETRON 4 MG PO TBDP: 4.0000 mg | ORAL_TABLET | Freq: Three times a day (TID) | ORAL | 0 refills | Status: DC | PRN

## 2023-01-11 MED ORDER — OXYCODONE HCL 5 MG PO TABS: 5.0000 mg | ORAL_TABLET | ORAL | 0 refills | Status: DC | PRN

## 2023-01-11 NOTE — MAU Provider Note (Signed)
History   CSN: [id number]  Arrival date and time: 01/11/23 1147   01/11/2023, 1252  First Provider Initiated Contact with Patient 01/11/23 1341     Chief Complaint  Patient presents with   Back Pain   Sheri Wilson is a 27 y/o female [redacted]w[redacted]d presenting today for right-sided back pain radiating to her right groin. Pain started at 8:30 a.m. this morning, is sharp and constant. Patient had one episode of nonbloody, nonbilious vomiting around 12 p.m. and ongoing nausea throughout today. Patient states the pain is a 7/10. Patient denies any urination frequency, dysuria, hematuria, or fever. Last bowel movement was Saturday. Patient states her bowel movement pattern has been once every 3 days since the start of her pregnancy. Patient denies history of nephrolithiasis.  OB History     Gravida  1   Para      Term      Preterm      AB      Living         SAB      IAB      Ectopic      Multiple      Live Births              Past Medical History:  Diagnosis Date   Asthma    Migraines     Past Surgical History:  Procedure Laterality Date   ARTHRODESIS FOOT WITH ILIAC CREST BONE GRAFT  2011   bone graft L foot sesamoid bone   BREAST SURGERY  07/2018   right breast biopsy   fai repair Right 2016   FAI repair right hip   FAI repair Right 2018   FAI repair/labrum repair right hip   SESAMOIDECTOMY Left 2013   SHOULDER ARTHROSCOPY W/ SUPERIOR LABRAL ANTERIOR POSTERIOR LESION REPAIR Right 2015   SHOULDER ARTHROSCOPY W/ SUPERIOR LABRAL ANTERIOR POSTERIOR REPAIR Right 2014    Family History  Problem Relation Age of Onset   Migraines Mother    Migraines Sister    Colon cancer Paternal Grandfather    Cancer Neg Hx    Diabetes Neg Hx    Stroke Neg Hx    Inflammatory bowel disease Neg Hx     Social History   Tobacco Use   Smoking status: Never   Smokeless tobacco: Never  Vaping Use   Vaping Use: Never used  Substance Use Topics   Alcohol use: Yes     Comment: socially   Drug use: Never    Allergies:  Allergies  Allergen Reactions   Indomethacin Other (See Comments)    Vision impairment   Sulfa Antibiotics Hives   Topamax [Topiramate]     Numbness in hands and feet; brain fog / memory issues     Medications Prior to Admission  Medication Sig Dispense Refill Last Dose   Cholecalciferol (VITAMIN D) 50 MCG (2000 UT) CAPS Take 1 capsule by mouth daily.      eletriptan (RELPAX) 20 MG tablet TAKE 1 TABLET BY MOUTH AS NEEDED FOR MIGRAINE OR HEADACHE. MAY REPEAT IN 2 HOURS IF HEADACHE PERSISTS OR RECURS. 10 tablet 2    Glutamine CRYS by Does not apply route.      magnesium oxide (MAG-OX) 400 (240 Mg) MG tablet Take 400 mg by mouth daily.      Multiple Vitamin (MULTIVITAMIN) tablet Take 1 tablet by mouth daily.      Prenatal Vit-Fe Fumarate-FA (MULTIVITAMIN-PRENATAL) 27-0.8 MG TABS tablet Take 1 tablet by mouth daily at 12 noon.  Review of Systems  Constitutional:  Negative for chills, diaphoresis and fever.  Respiratory:  Negative for chest tightness and shortness of breath.   Cardiovascular:  Negative for chest pain, palpitations and leg swelling.  Gastrointestinal:  Positive for nausea and vomiting. Negative for abdominal pain, blood in stool and diarrhea.  Genitourinary:  Positive for flank pain and pelvic pain. Negative for decreased urine volume, difficulty urinating, dysuria, frequency, hematuria, urgency, vaginal bleeding and vaginal discharge.  Musculoskeletal:  Positive for back pain. Negative for gait problem.  Skin:  Negative for rash.   Physical Exam   Blood pressure 108/66, pulse 82, temperature 98.2 F (36.8 C), temperature source Oral, resp. rate 19, height 5\' 4"  (1.626 m), weight 65.4 kg, last menstrual period 04/04/2022, SpO2 99 %.  Physical Exam Vitals and nursing note reviewed.  Constitutional:      Appearance: Normal appearance.  HENT:     Head: Normocephalic and atraumatic.  Eyes:     General: No  scleral icterus.    Conjunctiva/sclera: Conjunctivae normal.  Cardiovascular:     Rate and Rhythm: Normal rate.  Pulmonary:     Effort: Pulmonary effort is normal.  Abdominal:     General: Abdomen is flat. There is no distension.     Palpations: Abdomen is soft. There is no mass.     Tenderness: There is no abdominal tenderness. There is no right CVA tenderness, left CVA tenderness or guarding.  Musculoskeletal:     Cervical back: Neck supple.     Right lower leg: No edema.     Left lower leg: No edema.  Skin:    General: Skin is warm and dry.  Neurological:     Mental Status: She is alert and oriented to person, place, and time.  Psychiatric:        Mood and Affect: Mood normal.        Behavior: Behavior normal.        Thought Content: Thought content normal.        Judgment: Judgment normal.     MAU Course  Procedures  Results for orders placed or performed during the hospital encounter of 01/11/23 (from the past 24 hour(s))  Urinalysis, Routine w reflex microscopic -Urine, Clean Catch     Status: Abnormal   Collection Time: 01/11/23 11:52 AM  Result Value Ref Range   Color, Urine STRAW (A) YELLOW   APPearance CLEAR CLEAR   Specific Gravity, Urine 1.006 1.005 - 1.030   pH 6.0 5.0 - 8.0   Glucose, UA NEGATIVE NEGATIVE mg/dL   Hgb urine dipstick NEGATIVE NEGATIVE   Bilirubin Urine NEGATIVE NEGATIVE   Ketones, ur 20 (A) NEGATIVE mg/dL   Protein, ur NEGATIVE NEGATIVE mg/dL   Nitrite NEGATIVE NEGATIVE   Leukocytes,Ua NEGATIVE NEGATIVE   US RENAL  Result Date: 01/11/2023 CLINICAL DATA:  Right flank pain EXAM: RENAL / URINARY TRACT ULTRASOUND COMPLETE COMPARISON:  None Available. FINDINGS: Right Kidney: Renal measurements: 11.7 x 5 x 4.7 cm = volume: 143 mL. There is mild prominence of collecting system in the right kidney. There is no perinephric fluid collection. There is no focal cortical thinning. Left Kidney: Renal measurements: 11 x 6.2 x 5.6 cm = volume: 199.5 mL.  There is mild prominence of collecting system in the left kidney. There is no focal cortical thinning. Cortical echogenicity is unremarkable. Bladder: Appears normal for degree of bladder distention. Technologist observed ureteral jets at both ureterovesical junctions. Other: None. IMPRESSION: There is slight prominence of pelvocaliceal systems  in both kidneys suggesting mild hydronephrosis. This may be due to previous ureteric obstruction or vesico renal reflux. Ureteral jets are observed at both ureterovesical junctions. Electronically Signed   By: Ernie Avena M.D.   On: 01/11/2023 15:25    MDM - Ordered urinalysis and right renal ultrasound (1357)  - Urinalysis: straw colored, 20 mg/dL ketones; all other results normal  - Renal Ultrasound: mild hydronephrosis - Offered nausea and pain medication, but patient denied both (1415) - Patient's pain and nausea increased. Given 8 mg Zofran PO once (1700) and Oxycodone 10 mg PO once (1700)  Assessment and Plan  Mild hydronephrosis - Patient will be discharged and receive Rx for oxycodone, zofran, and Tamsulosin. - Patient instructed to schedule a follow up appointment with urology. - Return precautions discussed with patient on if/when to return to MAU.  Sheri Wilson 01/11/2023, 2:04 PM

## 2023-01-11 NOTE — MAU Provider Note (Signed)
Chief Complaint:  Back Pain   HPI   Event Date/Time   First Provider Initiated Contact with Patient 01/11/23 1341     Sheri Wilson is a 27 y.o. G1P0 at [redacted]w[redacted]d who presents to maternity admissions reporting severe, sudden onset but intermittent right flank pain that began last night. Pain radiates to her side and down into the right side of her pelvis but does not feel like cramping. When it happens, the pain is 10/10 and makes her nauseated, had one episode of emesis overnight. Had an episode on the way here, it is subsiding, pain is currently 5/10 but still sore. Denies dysuria, frequency/urgency or fever. Has not taken anything for the pain. No other physical complaints.  Pregnancy Course: Receives care from [facility], was seen in the office today with a clear UA but sent here for further evaluation of possible nephrolithiasis.  Past Medical History:  Diagnosis Date   Asthma    Migraines    OB History  Gravida Para Term Preterm AB Living  1            SAB IAB Ectopic Multiple Live Births               # Outcome Date GA Lbr Len/2nd Weight Sex Delivery Anes PTL Lv  1 Current            Past Surgical History:  Procedure Laterality Date   ARTHRODESIS FOOT WITH ILIAC CREST BONE GRAFT  2011   bone graft L foot sesamoid bone   BREAST SURGERY  07/2018   right breast biopsy   fai repair Right 2016   FAI repair right hip   FAI repair Right 2018   FAI repair/labrum repair right hip   SESAMOIDECTOMY Left 2013   SHOULDER ARTHROSCOPY W/ SUPERIOR LABRAL ANTERIOR POSTERIOR LESION REPAIR Right 2015   SHOULDER ARTHROSCOPY W/ SUPERIOR LABRAL ANTERIOR POSTERIOR REPAIR Right 2014   Family History  Problem Relation Age of Onset   Migraines Mother    Migraines Sister    Colon cancer Paternal Grandfather    Cancer Neg Hx    Diabetes Neg Hx    Stroke Neg Hx    Inflammatory bowel disease Neg Hx    Social History   Tobacco Use   Smoking status: Never   Smokeless tobacco: Never   Vaping Use   Vaping Use: Never used  Substance Use Topics   Alcohol use: Yes    Comment: socially   Drug use: Never   Allergies  Allergen Reactions   Indomethacin Other (See Comments)    Vision impairment   Sulfa Antibiotics Hives   Topamax [Topiramate]     Numbness in hands and feet; brain fog / memory issues    No medications prior to admission.   I have reviewed patient's Past Medical Hx, Surgical Hx, Family Hx, Social Hx, medications and allergies.   ROS  Pertinent items noted in HPI and remainder of comprehensive ROS otherwise negative.   PHYSICAL EXAM  Patient Vitals for the past 24 hrs:  BP Temp Temp src Pulse Resp SpO2 Height Weight  01/11/23 1837 104/69 -- -- 74 -- -- -- --  01/11/23 1257 108/66 98.2 F (36.8 C) Oral 82 19 99 % -- --  01/11/23 1252 -- -- -- -- -- -- 5\' 4"  (1.626 m) 144 lb 3.2 oz (65.4 kg)   Constitutional: Well-developed, well-nourished female in no acute distress.  Cardiovascular: normal rate & rhythm, warm and well-perfused Respiratory: normal effort, no  problems with respiration noted GI: Abd soft, non-tender, non-distended MS: Extremities nontender, no edema, normal ROM Neurologic: Alert and oriented x 4.  GU: no CVA tenderness (twice, assessed by PA and CNM) Pelvic: exam deferred  FHR: 144   Labs: Results for orders placed or performed during the hospital encounter of 01/11/23 (from the past 24 hour(s))  Urinalysis, Routine w reflex microscopic -Urine, Clean Catch     Status: Abnormal   Collection Time: 01/11/23 11:52 AM  Result Value Ref Range   Color, Urine STRAW (A) YELLOW   APPearance CLEAR CLEAR   Specific Gravity, Urine 1.006 1.005 - 1.030   pH 6.0 5.0 - 8.0   Glucose, UA NEGATIVE NEGATIVE mg/dL   Hgb urine dipstick NEGATIVE NEGATIVE   Bilirubin Urine NEGATIVE NEGATIVE   Ketones, ur 20 (A) NEGATIVE mg/dL   Protein, ur NEGATIVE NEGATIVE mg/dL   Nitrite NEGATIVE NEGATIVE   Leukocytes,Ua NEGATIVE NEGATIVE   Imaging:  US  RENAL  Result Date: 01/11/2023 CLINICAL DATA:  Right flank pain EXAM: RENAL / URINARY TRACT ULTRASOUND COMPLETE COMPARISON:  None Available. FINDINGS: Right Kidney: Renal measurements: 11.7 x 5 x 4.7 cm = volume: 143 mL. There is mild prominence of collecting system in the right kidney. There is no perinephric fluid collection. There is no focal cortical thinning. Left Kidney: Renal measurements: 11 x 6.2 x 5.6 cm = volume: 199.5 mL. There is mild prominence of collecting system in the left kidney. There is no focal cortical thinning. Cortical echogenicity is unremarkable. Bladder: Appears normal for degree of bladder distention. Technologist observed ureteral jets at both ureterovesical junctions. Other: None. IMPRESSION: There is slight prominence of pelvocaliceal systems in both kidneys suggesting mild hydronephrosis. This may be due to previous ureteric obstruction or vesico renal reflux. Ureteral jets are observed at both ureterovesical junctions. Electronically Signed   By: Ernie Avena M.D.   On: 01/11/2023 15:25    MDM & MAU COURSE  MDM: Moderate  MAU Course: Orders Placed This Encounter  Procedures   US RENAL   Urinalysis, Routine w reflex microscopic -Urine, Clean Catch   Ambulatory referral to Urology   Discharge patient   Meds ordered this encounter  Medications   ondansetron (ZOFRAN-ODT) disintegrating tablet 8 mg   oxyCODONE (Oxy IR/ROXICODONE) immediate release tablet 10 mg   oxyCODONE (ROXICODONE) 5 MG immediate release tablet    Sig: Take 1 tablet (5 mg total) by mouth every 4 (four) hours as needed for severe pain.    Dispense:  16 tablet    Refill:  0   ondansetron (ZOFRAN-ODT) 4 MG disintegrating tablet    Sig: Take 1 tablet (4 mg total) by mouth every 8 (eight) hours as needed for nausea or vomiting.    Dispense:  15 tablet    Refill:  0   tamsulosin (FLOMAX) 0.4 MG CAPS capsule    Sig: Take 1 capsule (0.4 mg total) by mouth daily. Stop taking if a rash/hives  develop.    Dispense:  30 capsule    Refill:  2    Low cross reaction potential, ok to dispense.   Initially declined meds, sent to ultrasound. On return from ultrasound, pain back to 8/10 with increasing nausea. Offered oxycodone and zofran which the patient accepted.   Ultrasound did not show current nephrolithiasis but given clinical presentation, will treat as such. Meds relieved pain/nausea. Discussed use of flomax for a week + oxycodone and zofran as needed. Pt agreed to plan of care (also discussed with  Dr. Rana Snare who also approved of this plan).  Discussed with pharmacy potential for cross-reaction to flomax, which is low so can be prescribed. Stable for discharge home.   ASSESSMENT   1. Right flank pain   2. Right nephrolithiasis   3. [redacted] weeks gestation of pregnancy   4. Presence of fetal heart sounds in second trimester    PLAN  Discharge home in stable condition with return precautions     Follow-up Information     [place] Follow up.   Why: as scheduled for ongoing prenatal care Contact information: [address] [place] [address] [phone]                 Allergies as of 01/11/2023       Reactions   Indomethacin Other (See Comments)   Vision impairment   Sulfa Antibiotics Hives   Topamax [topiramate]    Numbness in hands and feet; brain fog / memory issues         Medication List     TAKE these medications    eletriptan 20 MG tablet Commonly known as: RELPAX TAKE 1 TABLET BY MOUTH AS NEEDED FOR MIGRAINE OR HEADACHE. MAY REPEAT IN 2 HOURS IF HEADACHE PERSISTS OR RECURS.   Glutamine Crys by Does not apply route.   magnesium oxide 400 (240 Mg) MG tablet Commonly known as: MAG-OX Take 400 mg by mouth daily.   multivitamin tablet Take 1 tablet by mouth daily.   multivitamin-prenatal 27-0.8 MG Tabs tablet Take 1 tablet by mouth daily at 12 noon.   ondansetron 4 MG disintegrating  tablet Commonly known as: ZOFRAN-ODT Take 1 tablet (4 mg total) by mouth every 8 (eight) hours as needed for nausea or vomiting.   oxyCODONE 5 MG immediate release tablet Commonly known as: Roxicodone Take 1 tablet (5 mg total) by mouth every 4 (four) hours as needed for severe pain.   tamsulosin 0.4 MG Caps capsule Commonly known as: FLOMAX Take 1 capsule (0.4 mg total) by mouth daily. Stop taking if a rash/hives develop.   Vitamin D 50 MCG (2000 UT) Caps Take 1 capsule by mouth daily.       Edd Arbour, CNM, MSN, IBCLC Certified Nurse Midwife, [facility]

## 2023-01-11 NOTE — MAU Note (Signed)
Sheri Wilson is a 27 y.o. at [redacted]w[redacted]d here in MAU reporting: back pain that began this morning @ 0830 this morning.  Reports seen by OB office today and instructed to be evaluated in MAU for possible kidney stones. Reports pain is on right side, it's sharp and radiated into her pelvis.  Denies dysuria and frequency, no hematuria. LMP: NA Onset of complaint: today Pain score: 7 Vitals:   01/11/23 1257  BP: 108/66  Pulse: 82  Resp: 19  Temp: 98.2 F (36.8 C)  SpO2: 99%     FHT:144 bpm Lab orders placed from triage:   UA

## 2023-02-11 LAB — HIV ANTIBODY (ROUTINE TESTING W REFLEX): HIV 1&2 Ab, 4th Generation: NONREACTIVE

## 2023-04-16 LAB — OB RESULTS CONSOLE GBS: GBS: NEGATIVE

## 2023-05-10 ENCOUNTER — Encounter: Payer: Self-pay | Admitting: Physician Assistant

## 2023-05-17 ENCOUNTER — Telehealth (HOSPITAL_COMMUNITY): Payer: Self-pay | Admitting: *Deleted

## 2023-05-17 ENCOUNTER — Encounter (HOSPITAL_COMMUNITY): Payer: Self-pay | Admitting: *Deleted

## 2023-05-17 NOTE — Telephone Encounter (Signed)
Preadmission screen  

## 2023-05-21 ENCOUNTER — Inpatient Hospital Stay (HOSPITAL_COMMUNITY): Payer: BC Managed Care – PPO

## 2023-05-22 ENCOUNTER — Encounter (HOSPITAL_COMMUNITY): Payer: Self-pay | Admitting: Obstetrics and Gynecology

## 2023-05-22 ENCOUNTER — Other Ambulatory Visit: Payer: Self-pay

## 2023-05-22 ENCOUNTER — Inpatient Hospital Stay (HOSPITAL_COMMUNITY)
Admission: AD | Admit: 2023-05-22 | Discharge: 2023-05-24 | DRG: 807 | Disposition: A | Discharge status: Home / Self Care | Payer: BC Managed Care – PPO | Attending: Obstetrics and Gynecology | Admitting: Obstetrics and Gynecology

## 2023-05-22 ENCOUNTER — Inpatient Hospital Stay (HOSPITAL_COMMUNITY): Payer: BC Managed Care – PPO | Admitting: Anesthesiology

## 2023-05-22 DIAGNOSIS — Z800 Family history of malignant neoplasm of digestive organs: Secondary | ICD-10-CM

## 2023-05-22 DIAGNOSIS — Z3A41 41 weeks gestation of pregnancy: Secondary | ICD-10-CM | POA: Diagnosis not present

## 2023-05-22 DIAGNOSIS — O480 Post-term pregnancy: Principal | ICD-10-CM | POA: Diagnosis present

## 2023-05-22 LAB — CBC
HCT: 37.7 % (ref 36.0–46.0)
Hemoglobin: 13.2 g/dL (ref 12.0–15.0)
MCH: 29.2 pg (ref 26.0–34.0)
MCHC: 35 g/dL (ref 30.0–36.0)
MCV: 83.4 fL (ref 80.0–100.0)
Platelets: 187 10*3/uL (ref 150–400)
RBC: 4.52 MIL/uL (ref 3.87–5.11)
RDW: 13.1 % (ref 11.5–15.5)
WBC: 8.3 10*3/uL (ref 4.0–10.5)
nRBC: 0 % (ref 0.0–0.2)

## 2023-05-22 LAB — TYPE AND SCREEN
ABO/RH(D): O NEG
Antibody Screen: POSITIVE

## 2023-05-22 LAB — RPR: RPR Ser Ql: NONREACTIVE

## 2023-05-22 MED ORDER — OXYCODONE-ACETAMINOPHEN 5-325 MG PO TABS: 1.0000 | ORAL_TABLET | ORAL | Status: DC | PRN

## 2023-05-22 MED ORDER — MISOPROSTOL 50MCG HALF TABLET
50.0000 ug | ORAL_TABLET | ORAL | Status: DC
  Administered 2023-05-22: 50 ug via ORAL
  Filled 2023-05-22: qty 1

## 2023-05-22 MED ORDER — ONDANSETRON HCL 4 MG/2ML IJ SOLN: 4.0000 mg | INTRAMUSCULAR | Status: DC | PRN

## 2023-05-22 MED ORDER — ACETAMINOPHEN 325 MG PO TABS
650.0000 mg | ORAL_TABLET | ORAL | Status: DC | PRN
  Administered 2023-05-23: 650 mg via ORAL
  Filled 2023-05-22: qty 2

## 2023-05-22 MED ORDER — OXYTOCIN BOLUS FROM INFUSION
333.0000 mL | Freq: Once | INTRAVENOUS | Status: AC
  Administered 2023-05-22: 333 mL via INTRAVENOUS

## 2023-05-22 MED ORDER — SIMETHICONE 80 MG PO CHEW: 80.0000 mg | CHEWABLE_TABLET | ORAL | Status: DC | PRN

## 2023-05-22 MED ORDER — ACETAMINOPHEN 325 MG PO TABS: 650.0000 mg | ORAL_TABLET | ORAL | Status: DC | PRN

## 2023-05-22 MED ORDER — OXYCODONE-ACETAMINOPHEN 5-325 MG PO TABS: 2.0000 | ORAL_TABLET | ORAL | Status: DC | PRN

## 2023-05-22 MED ORDER — FENTANYL CITRATE (PF) 100 MCG/2ML IJ SOLN: 50.0000 ug | INTRAMUSCULAR | Status: DC | PRN

## 2023-05-22 MED ORDER — DIPHENHYDRAMINE HCL 25 MG PO CAPS: 25.0000 mg | ORAL_CAPSULE | Freq: Four times a day (QID) | ORAL | Status: DC | PRN

## 2023-05-22 MED ORDER — EPHEDRINE 5 MG/ML INJ: 10.0000 mg | INTRAVENOUS | Status: DC | PRN

## 2023-05-22 MED ORDER — TERBUTALINE SULFATE 1 MG/ML IJ SOLN: 0.2500 mg | Freq: Once | INTRAMUSCULAR | Status: DC | PRN

## 2023-05-22 MED ORDER — MISOPROSTOL 25 MCG QUARTER TABLET
25.0000 ug | ORAL_TABLET | Freq: Once | ORAL | Status: AC
  Administered 2023-05-22: 25 ug via VAGINAL
  Filled 2023-05-22: qty 1

## 2023-05-22 MED ORDER — OXYTOCIN-SODIUM CHLORIDE 30-0.9 UT/500ML-% IV SOLN
2.5000 [IU]/h | INTRAVENOUS | Status: DC
  Administered 2023-05-22: 2.5 [IU]/h via INTRAVENOUS
  Filled 2023-05-22: qty 500

## 2023-05-22 MED ORDER — FENTANYL-BUPIVACAINE-NACL 0.5-0.125-0.9 MG/250ML-% EP SOLN
12.0000 mL/h | EPIDURAL | Status: DC | PRN
  Administered 2023-05-22: 12 mL/h via EPIDURAL
  Filled 2023-05-22: qty 250

## 2023-05-22 MED ORDER — DIPHENHYDRAMINE HCL 50 MG/ML IJ SOLN: 12.5000 mg | INTRAMUSCULAR | Status: DC | PRN

## 2023-05-22 MED ORDER — COCONUT OIL OIL: 1.0000 | TOPICAL_OIL | Status: DC | PRN

## 2023-05-22 MED ORDER — MEASLES, MUMPS & RUBELLA VAC IJ SOLR
0.5000 mL | Freq: Once | INTRAMUSCULAR | Status: DC
  Filled 2023-05-22: qty 0.5

## 2023-05-22 MED ORDER — DIBUCAINE (PERIANAL) 1 % EX OINT: 1.0000 | TOPICAL_OINTMENT | CUTANEOUS | Status: DC | PRN

## 2023-05-22 MED ORDER — ZOLPIDEM TARTRATE 5 MG PO TABS: 5.0000 mg | ORAL_TABLET | Freq: Every evening | ORAL | Status: DC | PRN

## 2023-05-22 MED ORDER — IBUPROFEN 600 MG PO TABS
600.0000 mg | ORAL_TABLET | Freq: Four times a day (QID) | ORAL | Status: DC
Start: 2023-05-23 — End: 2023-05-24
  Administered 2023-05-23 (×2): 600 mg via ORAL
  Filled 2023-05-22 (×4): qty 1

## 2023-05-22 MED ORDER — PHENYLEPHRINE 80 MCG/ML (10ML) SYRINGE FOR IV PUSH (FOR BLOOD PRESSURE SUPPORT)
80.0000 ug | PREFILLED_SYRINGE | INTRAVENOUS | Status: DC | PRN
  Administered 2023-05-22 (×2): 80 ug via INTRAVENOUS
  Filled 2023-05-22: qty 10

## 2023-05-22 MED ORDER — PHENYLEPHRINE 80 MCG/ML (10ML) SYRINGE FOR IV PUSH (FOR BLOOD PRESSURE SUPPORT): 80.0000 ug | PREFILLED_SYRINGE | INTRAVENOUS | Status: DC | PRN

## 2023-05-22 MED ORDER — LACTATED RINGERS IV SOLN: 500.0000 mL | Freq: Once | INTRAVENOUS | Status: DC

## 2023-05-22 MED ORDER — LIDOCAINE HCL (PF) 1 % IJ SOLN: 30.0000 mL | INTRAMUSCULAR | Status: DC | PRN

## 2023-05-22 MED ORDER — LACTATED RINGERS IV SOLN: INTRAVENOUS | Status: DC

## 2023-05-22 MED ORDER — SENNOSIDES-DOCUSATE SODIUM 8.6-50 MG PO TABS
2.0000 | ORAL_TABLET | Freq: Every day | ORAL | Status: DC
  Administered 2023-05-23 – 2023-05-24 (×2): 2 via ORAL
  Filled 2023-05-22 (×2): qty 2

## 2023-05-22 MED ORDER — PRENATAL MULTIVITAMIN CH
1.0000 | ORAL_TABLET | Freq: Every day | ORAL | Status: DC
  Administered 2023-05-23 – 2023-05-24 (×2): 1 via ORAL
  Filled 2023-05-22 (×2): qty 1

## 2023-05-22 MED ORDER — WITCH HAZEL-GLYCERIN EX PADS: 1.0000 | MEDICATED_PAD | CUTANEOUS | Status: DC | PRN

## 2023-05-22 MED ORDER — LIDOCAINE HCL (PF) 1 % IJ SOLN
INTRAMUSCULAR | Status: DC | PRN
  Administered 2023-05-22: 10 mL via EPIDURAL

## 2023-05-22 MED ORDER — OXYTOCIN-SODIUM CHLORIDE 30-0.9 UT/500ML-% IV SOLN
1.0000 m[IU]/min | INTRAVENOUS | Status: DC
  Administered 2023-05-22: 2 m[IU]/min via INTRAVENOUS

## 2023-05-22 MED ORDER — LACTATED RINGERS IV SOLN
500.0000 mL | INTRAVENOUS | Status: DC | PRN
  Administered 2023-05-22: 500 mL via INTRAVENOUS

## 2023-05-22 MED ORDER — EPHEDRINE 5 MG/ML INJ
10.0000 mg | INTRAVENOUS | Status: DC | PRN
  Filled 2023-05-22: qty 5

## 2023-05-22 MED ORDER — SOD CITRATE-CITRIC ACID 500-334 MG/5ML PO SOLN: 30.0000 mL | ORAL | Status: DC | PRN

## 2023-05-22 MED ORDER — MEDROXYPROGESTERONE ACETATE 150 MG/ML IM SUSP
150.0000 mg | INTRAMUSCULAR | Status: DC | PRN
Start: 2023-05-22 — End: 2023-05-24

## 2023-05-22 MED ORDER — ONDANSETRON HCL 4 MG PO TABS: 4.0000 mg | ORAL_TABLET | ORAL | Status: DC | PRN

## 2023-05-22 MED ORDER — ONDANSETRON HCL 4 MG/2ML IJ SOLN
4.0000 mg | Freq: Four times a day (QID) | INTRAMUSCULAR | Status: DC | PRN
  Administered 2023-05-22: 4 mg via INTRAVENOUS
  Filled 2023-05-22: qty 2

## 2023-05-22 MED ORDER — TETANUS-DIPHTH-ACELL PERTUSSIS 5-2.5-18.5 LF-MCG/0.5 IM SUSY
0.5000 mL | PREFILLED_SYRINGE | Freq: Once | INTRAMUSCULAR | Status: DC
  Filled 2023-05-22: qty 0.5

## 2023-05-22 MED ORDER — BENZOCAINE-MENTHOL 20-0.5 % EX AERO: 1.0000 | INHALATION_SPRAY | CUTANEOUS | Status: DC | PRN

## 2023-05-22 NOTE — Plan of Care (Signed)
[facility] Tonsillectomy/Adenoidectomy, Postoperative PEDS care plan entered in error.

## 2023-05-22 NOTE — Anesthesia Preprocedure Evaluation (Signed)
Anesthesia Evaluation  Patient identified by MRN, date of birth, ID band Patient awake    Reviewed: Allergy & Precautions, H&P , Patient's Chart, lab work & pertinent test results  Airway Mallampati: I       Dental no notable dental hx.    Pulmonary neg pulmonary ROS   Pulmonary exam normal        Cardiovascular negative cardio ROS Normal cardiovascular exam     Neuro/Psych    GI/Hepatic negative GI ROS, Neg liver ROS,,,  Endo/Other  negative endocrine ROS    Renal/GU negative Renal ROS  negative genitourinary   Musculoskeletal negative musculoskeletal ROS (+)    Abdominal Normal abdominal exam  (+)   Peds  Hematology negative hematology ROS (+)   Anesthesia Other Findings   Reproductive/Obstetrics (+) Pregnancy                             Anesthesia Physical Anesthesia Plan  ASA: 2  Anesthesia Plan: Epidural   Post-op Pain Management:    Induction:   PONV Risk Score and Plan:   Airway Management Planned:   Additional Equipment:   Intra-op Plan:   Post-operative Plan:   Informed Consent: I have reviewed the patients History and Physical, chart, labs and discussed the procedure including the risks, benefits and alternatives for the proposed anesthesia with the patient or authorized representative who has indicated his/her understanding and acceptance.       Plan Discussed with:   Anesthesia Plan Comments:        Anesthesia Quick Evaluation

## 2023-05-22 NOTE — H&P (Signed)
Sheri Wilson is a 27 y.o. female presenting for IOL due to postdates pregnancy.  Pregnancy uncomplicated and GBS neg. OB History     Gravida  1   Para      Term      Preterm      AB      Living         SAB      IAB      Ectopic      Multiple      Live Births             Past Medical History:  Diagnosis Date   Asthma    Migraines    Past Surgical History:  Procedure Laterality Date   ARTHRODESIS FOOT WITH ILIAC CREST BONE GRAFT  2011   bone graft L foot sesamoid bone   BREAST SURGERY  07/2018   right breast biopsy   fai repair Right 2016   FAI repair right hip   FAI repair Right 2018   FAI repair/labrum repair right hip   SESAMOIDECTOMY Left 2013   SHOULDER ARTHROSCOPY W/ SUPERIOR LABRAL ANTERIOR POSTERIOR LESION REPAIR Right 2015   SHOULDER ARTHROSCOPY W/ SUPERIOR LABRAL ANTERIOR POSTERIOR REPAIR Right 2014   Family History: family history includes Colon cancer in her paternal grandfather; Migraines in her mother and sister. Social History:  reports that she has never smoked. She has never used smokeless tobacco. She reports current alcohol use. She reports that she does not use drugs.     Maternal Diabetes: No Genetic Screening: Normal Maternal Ultrasounds/Referrals: Normal Fetal Ultrasounds or other Referrals:  None Maternal Substance Abuse:  No Significant Maternal Medications:  None Significant Maternal Lab Results:  Group B Strep negative Number of Prenatal Visits:greater than 3 verified prenatal visits Maternal Vaccinations:  Other Comments:  None  Review of Systems History Dilation: 2 Effacement (%): 70 Station: -2 Exam by:: Dr. Rana Snare Blood pressure 113/71, pulse 68, temperature 98 F (36.7 C), temperature source Oral, resp. rate 18, height 5\' 4"  (1.626 m), weight 75.1 kg, last menstrual period 04/04/2022. Exam Physical Exam  Vitals and nursing note reviewed. Exam conducted with a chaperone present.  Constitutional:       Appearance: Normal appearance.  HENT:     Head: Normocephalic.  Eyes:     Pupils: Pupils are equal, round, and reactive to light.  Cardiovascular:     Rate and Rhythm: Normal rate and regular rhythm.     Pulses: Normal pulses.  Abdominal:     General: Abdomen is Gravid, nontender Neurological:     Mental Status: She is alert. Prenatal labs: ABO, Rh: --/--/O NEG (10/19 0509) Antibody: POS (10/19 0509) Rubella: Immune (03/06 0000) RPR: NON REACTIVE (10/19 0510)  HBsAg: Negative (03/06 0000)  HIV: Non-Reactive (07/11 0000)  GBS: Negative/-- (09/13 0000)   Assessment/Plan: IUP at 41 weeks IOL due to postdates S/p cytotec now with AROM and early labor Anticipate SVD   Turner Daniels 05/22/2023, 10:08 AM

## 2023-05-22 NOTE — Anesthesia Procedure Notes (Signed)
Epidural Patient location during procedure: OB Start time: 05/22/2023 1:07 PM End time: 05/22/2023 1:10 PM  Staffing Anesthesiologist: Leilani Able, MD Performed: anesthesiologist   Preanesthetic Checklist Completed: patient identified, IV checked, site marked, risks and benefits discussed, surgical consent, monitors and equipment checked, pre-op evaluation and timeout performed  Epidural Patient position: sitting Prep: DuraPrep and site prepped and draped Patient monitoring: continuous pulse ox and blood pressure Approach: midline Location: L3-L4 Injection technique: LOR air  Needle:  Needle type: Tuohy  Needle gauge: 17 G Needle length: 9 cm and 9 Needle insertion depth: 5 cm Catheter type: closed end flexible Catheter size: 19 Gauge Catheter at skin depth: 10 cm Test dose: negative and Other  Assessment Events: blood not aspirated, no cerebrospinal fluid, injection not painful, no injection resistance, no paresthesia and negative IV test  Additional Notes Reason for block:procedure for pain

## 2023-05-23 LAB — CBC
HCT: 33.1 % — ABNORMAL LOW (ref 36.0–46.0)
Hemoglobin: 11.4 g/dL — ABNORMAL LOW (ref 12.0–15.0)
MCH: 28.9 pg (ref 26.0–34.0)
MCHC: 34.4 g/dL (ref 30.0–36.0)
MCV: 84 fL (ref 80.0–100.0)
Platelets: 168 10*3/uL (ref 150–400)
RBC: 3.94 MIL/uL (ref 3.87–5.11)
RDW: 13 % (ref 11.5–15.5)
WBC: 14.2 10*3/uL — ABNORMAL HIGH (ref 4.0–10.5)
nRBC: 0 % (ref 0.0–0.2)

## 2023-05-23 NOTE — Plan of Care (Signed)

## 2023-05-23 NOTE — Anesthesia Postprocedure Evaluation (Signed)
Anesthesia Post Note  Patient: Kersti Delvecchio  Procedure(s) Performed: AN AD HOC LABOR EPIDURAL     Patient location during evaluation: Mother Baby Anesthesia Type: Epidural Level of consciousness: awake, awake and alert and oriented Pain management: satisfactory to patient Vital Signs Assessment: post-procedure vital signs reviewed and stable Respiratory status: spontaneous breathing, nonlabored ventilation and respiratory function stable Cardiovascular status: blood pressure returned to baseline and stable Postop Assessment: no headache, no backache, no apparent nausea or vomiting, able to ambulate and adequate PO intake Anesthetic complications: no   No notable events documented.  Last Vitals:  Vitals:   05/23/23 0010 05/23/23 0510  BP: 115/68 118/76  Pulse: 63 67  Resp: 19 17  Temp: 36.9 C 36.6 C  SpO2: 98% 98%    Last Pain:  Vitals:   05/23/23 0510  TempSrc: Oral  PainSc: 0-No pain   Pain Goal: Patients Stated Pain Goal: 0 (05/22/23 1817)                 Adrien Dietzman

## 2023-05-23 NOTE — Lactation Note (Signed)
This note was copied from a baby's chart. Lactation Consultation Note  Patient Name: Sheri Wilson FAOZH'[id number] Date: 05/23/2023 Age:29 hours    P1- LC to MOB's room for consult. MOB and FOB were not in room. The visitors in the room stated that they went for a walk. Lactation team will attempt to see them later.  Dema Severin BS, IBCLC 05/23/2023, 3:10 PM

## 2023-05-23 NOTE — Lactation Note (Signed)
This note was copied from a baby's chart. Lactation Consultation Note  Patient Name: Sheri Wilson [id number] Date: 05/23/2023 Age:29 hours Reason for consult: Initial assessment;Primapara;Term Mom stated the baby had latched after delivery well and LC assisted to the breast and BF for 45 minutes.  Heard swallows. Colostrum easily expressed. A lot of teaching done during the feeding. Newborn feeding habits, behavior, STS, I&O, positions, support, props, swaddling, milk storage, pumping reviewed. Answered any questions mom had.  Mom encouraged to feed baby 8-12 times/24 hours and with feeding cues.  Encouraged mom to call for assistance as needed. Praised mom for a great feeding.  Maternal Data Has patient been taught Hand Expression?: Yes Does the patient have breastfeeding experience prior to this delivery?: No  Feeding    LATCH Score Latch: Grasps breast easily, tongue down, lips flanged, rhythmical sucking.  Audible Swallowing: Spontaneous and intermittent  Type of Nipple: Everted at rest and after stimulation  Comfort (Breast/Nipple): Soft / non-tender  Hold (Positioning): Assistance needed to correctly position infant at breast and maintain latch.  LATCH Score: 9   Lactation Tools Discussed/Used    Interventions Interventions: Breast feeding basics reviewed;Assisted with latch;Skin to skin;Breast massage;Hand express;Breast compression;Adjust position;Support pillows;Position options;Education;LC Services brochure  Discharge    Consult Status Consult Status: Follow-up Date: 05/23/23 Follow-up type: In-patient    Camryn Lampson, Diamond Nickel 05/23/2023, 1:29 AM

## 2023-05-23 NOTE — Progress Notes (Signed)
Post Partum Day 1 Subjective: no complaints, up ad lib, voiding, and tolerating PO  Objective: Blood pressure 118/76, pulse 67, temperature 97.8 F (36.6 C), temperature source Oral, resp. rate 17, height 5\' 4"  (1.626 m), weight 75.1 kg, last menstrual period 04/04/2022, SpO2 98%, unknown if currently breastfeeding.  Physical Exam:  General: alert, cooperative, appears stated age, and no distress Lochia: appropriate Uterine Fundus: firm Incision: healing well DVT Evaluation: No evidence of DVT seen on physical exam.  Recent Labs    05/22/23 0510 05/23/23 0407  HGB 13.2 11.4*  HCT 37.7 33.1*    Assessment/Plan: Plan for discharge tomorrow and Circumcision prior to discharge   LOS: 1 day   Turner Daniels, MD 05/23/2023, 9:31 AM

## 2023-05-24 ENCOUNTER — Encounter (HOSPITAL_COMMUNITY): Payer: Self-pay | Admitting: Obstetrics and Gynecology

## 2023-05-24 MED ORDER — ACETAMINOPHEN 325 MG PO TABS: 650.0000 mg | ORAL_TABLET | ORAL | 0 refills | Status: DC | PRN

## 2023-05-24 NOTE — Lactation Note (Signed)
This note was copied from a baby's chart. Lactation Consultation Note  Patient Name: Sheri Wilson Date: 05/24/2023 Age:29 hours Reason for consult: Follow-up assessment;Term;Primapara Mom stated baby is cluster feeding every 45 minutes. Dad is having to hold baby between feedings. He is fussy. LC got baby to latch. It appeared it may had been coming out to fast at first. Laid mom back some and then baby latched well and BF well. Swallows heard. Baby felt warm after feeding. Temp took. Suggested mom not have blanket on outside of baby. Baby had 2 large emesis of clear fluid and then some BM in it as well. Got baby settled and sleep. Parents exhausted and sleeping. Reported to RN.  Maternal Data Has patient been taught Hand Expression?: Yes Does the patient have breastfeeding experience prior to this delivery?: No  Feeding    LATCH Score Latch: Grasps breast easily, tongue down, lips flanged, rhythmical sucking.  Audible Swallowing: Spontaneous and intermittent  Type of Nipple: Everted at rest and after stimulation  Comfort (Breast/Nipple): Soft / non-tender  Hold (Positioning): Assistance needed to correctly position infant at breast and maintain latch.  LATCH Score: 9   Lactation Tools Discussed/Used    Interventions Interventions: Breast feeding basics reviewed;Assisted with latch;Breast massage;Skin to skin;Hand express;Breast compression;Adjust position;Support pillows;Position options;Education  Discharge    Consult Status Consult Status: Follow-up Date: 05/24/23 Follow-up type: In-patient    [name], Diamond Nickel 05/24/2023, 1:00 AM

## 2023-05-24 NOTE — Progress Notes (Signed)
Postpartum Progress Note  Post Partum Day 2 s/p spontaneous vaginal delivery.  Patient reports well-controlled pain, ambulating without difficulty, voiding spontaneously, tolerating PO.  Vaginal bleeding is appropriate.   Objective: Blood pressure 105/64, pulse 62, temperature 98.2 F (36.8 C), temperature source Oral, resp. rate 17, height 5\' 4"  (1.626 m), weight 75.1 kg, last menstrual period 04/04/2022, SpO2 100%, unknown if currently breastfeeding.  Physical Exam:  General: alert and no distress Lochia: appropriate Uterine Fundus: firm DVT Evaluation: No evidence of DVT seen on physical exam.  Recent Labs    05/22/23 0510 05/23/23 0407  HGB 13.2 11.4*  HCT 37.7 33.1*    Assessment/Plan: Postpartum Day 2, s/p vaginal delivery. Continue routine postpartum care Baby boy - desires circ. Will perform if cleared by nursery.  Lactation following Anticipate discharge home today   LOS: 2 days   Lyn Henri 05/24/2023, 7:43 AM

## 2023-05-24 NOTE — Discharge Summary (Signed)
Obstetric Discharge Summary  Sheri Wilson is a 27 y.o. female that presented on 05/22/2023 for post-dates IOL.  Her labor course was uncomplicated and she delivered a viable female infant on 05/22/2023.  Her postpartum course was uncomplicated and on PPD#1, she reported well controlled pain, spontaneous voiding, ambulating without difficulty, and tolerating PO.  She was stable for discharge home on 05/24/2023 with plans for in-office follow up.  Hemoglobin  Date Value Ref Range Status  05/23/2023 11.4 (L) 12.0 - 15.0 g/dL Final   HCT  Date Value Ref Range Status  05/23/2023 33.1 (L) 36.0 - 46.0 % Final    Physical Exam:  General: alert and no distress Lochia: appropriate Uterine Fundus: firm DVT Evaluation: No evidence of DVT seen on physical exam.  Discharge Diagnoses: Term Pregnancy-delivered  Discharge Information: Date: 05/24/2023 Activity: Pelvic rest, as tolerated Diet: routine Medications: Tylenol, motrin Condition: stable Instructions: Refer to practice specific booklet.  Discussed prior to discharge.  Discharge to: Home  Follow-up Information     [facility] Follow up.   Why: Please follow up for a 6 week postpartum visit. Contact information: [address] [place] [address] [phone]                 Newborn Data: Live born female  Birth Weight: 8 lb 5.3 oz (3780 g) APGAR: 9, 9  Newborn Delivery   Birth date/time: 05/22/2023 20:30:00 Delivery type: Vaginal, Spontaneous     Home with mother.  Lyn Henri 05/24/2023, 8:09 PM

## 2023-05-24 NOTE — Lactation Note (Signed)
This note was copied from a baby's chart. Lactation Consultation Note  Patient Name: Sheri Wilson [id number] Date: 05/24/2023 Age:29 hours Reason for consult: Follow-up assessment;Primapara;1st time breastfeeding;Term;Infant weight loss  P1- Infant has a weight loss of 7.14% today. Feedings have been great, infant has had a lot of urine and stool output as well as a lot of emesis output since birth. MOB's colostrum is easily expressible and infant's latches are very nutritive. LC reassured MOB that she is doing a great job. Last night infant started cluster feeding and MOB denies having any pain or discomfort on her nipples.   LC encouraged MOB to call for feeding assistance if needed when infant comes back from his circumcision. LC reviewed feeding infant on cue 8-12x in 24 hrs, not allowing him to go over 3 hrs without a feeding, LC services handout, CDC milk storage guidelines and engorgement/breast care.  Maternal Data Has patient been taught Hand Expression?: Yes Does the patient have breastfeeding experience prior to this delivery?: No  Feeding Mother's Current Feeding Choice: Breast Milk  Lactation Tools Discussed/Used Pump Education: Milk Storage  Interventions Interventions: Breast feeding basics reviewed;Education;LC Services brochure  Discharge Discharge Education: Engorgement and breast care;Warning signs for feeding baby Pump: DEBP;Personal (Medela)  Consult Status Consult Status: Complete Date: 05/24/23    Dema Severin BS, IBCLC 05/24/2023, 1:12 PM

## 2023-05-24 NOTE — Progress Notes (Signed)
MOB was referred for history of anxiety.  * Referral screened out by Clinical Social Worker because none of the following criteria appear to apply:  ~ History of anxiety during this pregnancy, or of post-partum depression following prior delivery.  ~ Diagnosis of anxiety within last 3 years   Per OB notes, MOB did not indicate any signs/symptoms during her pregnancy. Anxiety dates back to 2019  OR  * MOB's symptoms currently being treated with medication and/or therapy.  Please contact the Clinical Social Worker if needs arise, by [facility] request, or if MOB scores greater than 9/yes to question 10 on Edinburgh Postpartum Depression Screen.  Enos Fling, Theresia Majors Clinical Social Worker [phone]

## 2023-06-12 ENCOUNTER — Telehealth (HOSPITAL_COMMUNITY): Payer: Self-pay

## 2023-06-12 NOTE — Telephone Encounter (Signed)
06/12/2023 1454  Name: Sheri Wilson MRN: [id number] DOB: [birth year]  Reason for Call:  Transition of Care Hospital Discharge Call  Contact Status: Patient Contact Status: Message  Language assistant needed: Interpreter Mode: Interpreter Not Needed        Follow-Up Questions:    Inocente Salles Postnatal Depression Scale:  In the Past 7 Days:    PHQ2-9 Depression Scale:     Discharge Follow-up:    Post-discharge interventions: NA  Signature  Signe Colt

## 2023-09-23 ENCOUNTER — Telehealth: Payer: BC Managed Care – PPO | Admitting: Physician Assistant

## 2023-09-23 DIAGNOSIS — J069 Acute upper respiratory infection, unspecified: Secondary | ICD-10-CM

## 2023-09-23 MED ORDER — BENZONATATE 100 MG PO CAPS
100.0000 mg | ORAL_CAPSULE | Freq: Three times a day (TID) | ORAL | 0 refills | Status: DC | PRN
Start: 2023-09-23 — End: 2023-09-23

## 2023-09-23 MED ORDER — BENZONATATE 100 MG PO CAPS
100.0000 mg | ORAL_CAPSULE | Freq: Three times a day (TID) | ORAL | 0 refills | Status: DC | PRN
Start: 2023-09-23 — End: 2023-12-21

## 2023-09-23 NOTE — Progress Notes (Signed)
 I have spent 5 minutes in review of e-visit questionnaire, review and updating patient chart, medical decision making and response to patient.   Piedad Climes, PA-C

## 2023-09-23 NOTE — Progress Notes (Signed)

## 2023-09-24 ENCOUNTER — Ambulatory Visit
Admission: EM | Admit: 2023-09-24 | Discharge: 2023-09-24 | Disposition: A | Discharge status: Home / Self Care | Payer: BC Managed Care – PPO | Attending: Family Medicine | Admitting: Family Medicine

## 2023-09-24 ENCOUNTER — Encounter: Payer: Self-pay | Admitting: Emergency Medicine

## 2023-09-24 DIAGNOSIS — J101 Influenza due to other identified influenza virus with other respiratory manifestations: Secondary | ICD-10-CM

## 2023-09-24 LAB — POC COVID19/FLU A&B COMBO
Covid Antigen, POC: NEGATIVE
Influenza A Antigen, POC: POSITIVE — AB
Influenza B Antigen, POC: NEGATIVE

## 2023-09-24 NOTE — ED Provider Notes (Signed)
[facility]    CSN: [id number] Arrival date & time: 09/24/23  1009      History   Chief Complaint No chief complaint on file.   HPI Sheri Wilson is a 28 y.o. female.   Presenting today with 3-day history of cough, congestion and now fever, fatigue since this morning.  Denies chest pain, shortness of breath, abdominal pain, vomiting, diarrhea.  Trying Delsym, Tylenol with minimal relief.  Of note, patient is about 4 months postpartum and breast-feeding.    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   Vitamin D deficiency 05/24/2018   Migraine headache without aura 03/21/2018   Generalized anxiety disorder 03/08/2018    Past Surgical History:  Procedure Laterality Date   ARTHRODESIS FOOT WITH ILIAC CREST BONE GRAFT  2011   bone graft L foot sesamoid bone   BREAST SURGERY  07/2018   right breast biopsy   fai repair Right 2016   FAI repair right hip   FAI repair Right 2018   FAI repair/labrum repair right hip   SESAMOIDECTOMY Left 2013   SHOULDER ARTHROSCOPY W/ SUPERIOR LABRAL ANTERIOR POSTERIOR LESION REPAIR Right 2015   SHOULDER ARTHROSCOPY W/ SUPERIOR LABRAL ANTERIOR POSTERIOR REPAIR Right 2014    OB History     Gravida  1   Para  1   Term  1   Preterm      AB      Living  1      SAB      IAB      Ectopic      Multiple  0   Live Births  1            Home Medications    Prior to Admission medications   Medication Sig Start Date End Date Taking? Authorizing Provider  acetaminophen (TYLENOL) 325 MG tablet Take 2 tablets (650 mg total) by mouth every 4 (four) hours as needed (for pain scale < 4). 05/24/23   Lyn Henri, MD  benzonatate (TESSALON) 100 MG capsule Take 1 capsule (100 mg total) by mouth 3 (three) times  daily as needed for cough. 09/23/23   Waldon Merl, PA-C  Glutamine CRYS by Does not apply route.    [provider]  magnesium oxide (MAG-OX) 400 (240 Mg) MG tablet Take 400 mg by mouth daily.    [provider]  Multiple Vitamin (MULTIVITAMIN) tablet Take 1 tablet by mouth daily.    [provider]  ondansetron (ZOFRAN-ODT) 4 MG disintegrating tablet Take 1 tablet (4 mg total) by mouth every 8 (eight) hours as needed for nausea or vomiting. 01/11/23   Bernerd Limbo, CNM  Prenatal Vit-Fe Fumarate-FA (MULTIVITAMIN-PRENATAL) 27-0.8 MG TABS tablet Take 1 tablet by mouth daily at 12 noon.    [provider]    Family History Family History  Problem Relation Age of Onset   Migraines Mother    Migraines Sister    Colon cancer Paternal Grandfather    Cancer Neg Hx    Diabetes Neg Hx    Stroke Neg Hx    Inflammatory bowel disease Neg Hx     Social History Social History   Tobacco Use   Smoking status: Never   Smokeless tobacco: Never  Vaping Use   Vaping status: Never Used  Substance Use Topics   Alcohol use: Yes    Comment: socially   Drug use: Never     Allergies   Indomethacin, Sulfa antibiotics, and Topamax [topiramate]   Review of Systems Review of Systems Per HPI  Physical Exam Triage Vital Signs ED Triage Vitals  Encounter Vitals Group     BP 09/24/23 1045 118/75     Systolic BP Percentile --      Diastolic BP Percentile --      Pulse Rate 09/24/23 1045 (!) 108     Resp 09/24/23 1045 18     Temp 09/24/23 1045 99 F (37.2 C)     Temp Source 09/24/23 1045 Oral     SpO2 09/24/23 1045 96 %     Weight --      Height --      Head Circumference --      Peak Flow --      Pain Score 09/24/23 1047 0     Pain Loc --      Pain Education --      Exclude from Growth Chart --    No data found.  Updated Vital Signs BP 118/75 (BP Location: Right Arm)   Pulse (!) 108   Temp 99 F (37.2 C) (Oral)   Resp 18   SpO2 96%    Breastfeeding Yes   Visual Acuity Right Eye Distance:   Left Eye Distance:   Bilateral Distance:    Right Eye Near:   Left Eye Near:    Bilateral Near:     Physical Exam Vitals and nursing note reviewed.  Constitutional:      Appearance: Normal appearance.  HENT:     Head: Atraumatic.     Right Ear: Tympanic membrane and external ear normal.     Left Ear: Tympanic membrane and external ear normal.     Nose: Rhinorrhea present.     Mouth/Throat:     Mouth: Mucous membranes are moist.     Pharynx: Posterior oropharyngeal erythema present.  Eyes:     Extraocular Movements: Extraocular movements intact.     Conjunctiva/sclera: Conjunctivae normal.  Cardiovascular:     Rate and Rhythm: Normal rate and regular rhythm.     Heart sounds: Normal heart sounds.  Pulmonary:     Effort: Pulmonary effort is normal.     Breath sounds: Normal breath sounds. No wheezing or rales.  Musculoskeletal:        General: Normal range of motion.     Cervical back: Normal range of motion and neck supple.  Skin:    General: Skin is warm and dry.  Neurological:     Mental Status: She is alert and oriented to person, place, and time.  Psychiatric:        Mood and Affect: Mood normal.        Thought Content: Thought content normal.      UC Treatments / Results  Labs (all labs ordered are listed, but only abnormal results are displayed) Labs Reviewed  POC COVID19/FLU A&B COMBO - Abnormal; Notable for the following components:      Result Value   Influenza A Antigen, POC Positive (*)    All other components within normal limits    EKG   Radiology No results found.  Procedures Procedures (including critical care time)  Medications Ordered in UC Medications - No data to display  Initial Impression / Assessment and Plan /  UC Course  I have reviewed the triage vital signs and the nursing notes.  Pertinent labs & imaging results that were available during my care of the patient were  reviewed by me and considered in my medical decision making (see chart for details).     Mildly tachycardic in triage, otherwise vital signs reassuring.  Rapid flu test positive for influenza A.  She declines Tamiflu at this time and wishes to proceed with supportive over-the-counter medications and home care.  Return for any worsening symptoms.  Final Clinical Impressions(s) / UC Diagnoses   Final diagnoses:  Influenza A     Discharge Instructions      You may take plain Mucinex, Delsym cough syrup, Tylenol and ibuprofen, Flonase nasal spray, warm honey teas, cough drops.  Follow-up for any significantly worsening symptoms.  I hope you feel better soon    ED Prescriptions   None    PDMP not reviewed this encounter.   Particia Nearing, [place] 09/24/23 1235

## 2023-09-24 NOTE — Discharge Instructions (Signed)
You may take plain Mucinex, Delsym cough syrup, Tylenol and ibuprofen, Flonase nasal spray, warm honey teas, cough drops.  Follow-up for any significantly worsening symptoms.  I hope you feel better soon

## 2023-09-24 NOTE — ED Triage Notes (Signed)
Cough x 3 days.  Fever this morning.  Has been taking delsym.

## 2023-12-21 ENCOUNTER — Telehealth: Admitting: Family Medicine

## 2023-12-21 DIAGNOSIS — L089 Local infection of the skin and subcutaneous tissue, unspecified: Secondary | ICD-10-CM

## 2023-12-21 DIAGNOSIS — L239 Allergic contact dermatitis, unspecified cause: Secondary | ICD-10-CM | POA: Diagnosis not present

## 2023-12-21 MED ORDER — TRIAMCINOLONE ACETONIDE 0.1 % EX CREA: 1.0000 | TOPICAL_CREAM | Freq: Two times a day (BID) | CUTANEOUS | 0 refills | Status: DC

## 2023-12-21 MED ORDER — CEPHALEXIN 500 MG PO CAPS: 500.0000 mg | ORAL_CAPSULE | Freq: Two times a day (BID) | ORAL | 0 refills | Status: AC

## 2023-12-21 NOTE — Progress Notes (Signed)
 E-Visit for [employer]  We are sorry that you are not feeing well.  Here is how we plan to help!  Based on what you have shared with me it looks like you have had an allergic reaction to the oily resin from a group of plants.  This resin is very sticky, so it easily attaches to your skin, clothing, tools equipment, and pet's fur.    This blistering rash is often called poison ivy rash although it can come from contact with the leaves, stems and roots of poison ivy, poison oak and poison sumac.  The oily resin contains urushiol (u-ROO-she-ol) that produces a skin rash on exposed skin.  The severity of the rash depends on the amount of urushiol that gets on your skin.  A section of skin with more urushiol on it may develop a rash sooner.  The rash usually develops 12-48 hours after exposure and can last two to three weeks.  Your skin must come in direct contact with the plant's oil to be affected.  Blister fluid doesn't spread the rash.  However, if you come into contact with a piece of clothing or pet fur that has urushiol on it, the rash may spread out.  You can also transfer the oil to other parts of your body with your fingers.  Often the rash looks like a straight line because of the way the plant brushes against your skin.  Most poison ivy treatments are usually limited to self-care methods. Small areas of the rash will typically go away on its own in two to three weeks.  Since your rash is limited, I am recommending that you follow these recommendations:  Make sure that the clothes you were wearing and any towels or sheets that may have come in contact with the oil (urushiol) are washed in detergent and hot water.  Apply Benadryl  or Caladryl lotion to the rash.  You may apply these as often as needed to control the itching.  Cool baths also often help with itching.  You may also apply an over-the-counter corticosteroid cream for the first few days.  Take oral antihistamines, such as diphenhydramine   (Benadryl , others), which may also help you sleep better.  Soak in a cool-water bath containing an oatmeal-based bath product (Aveeno).  Place Cool, wet compresses on the affected area for 15 to 30 minutes several times a day.  Avoid a hot shower or bath as this may increase your itching.     I have developed the following plan to treat your condition topical kenalog  and if not improving or worsening in 48 hours you can start an delayed antibiotic-Keflex .  Please watch for gi upset in baby you are breastfeeding- if you need antibiotic- only use if not improving   What can you do to prevent this rash?  Avoid the plants.  Learn how to identify poison ivy, poison oak and poison sumac in all seasons.  When hiking or engaging in other activities that might expose you to these plants, try to stay on cleared pathways.  If camping, make sure you pitch your tent in an area free of these plants.  Keep pets from running through wooded areas so that urushiol doesn't accidentally stick to their fur, which you may touch.  Remove or kill the plants.  In your yard, you can get rid of poison ivy by applying an herbicide or pulling it out of the ground, including the roots, while wearing heavy gloves.  Afterward remove the  gloves and thoroughly wash them and your hands.  Don't burn poison ivy or related plants because the urushiol can be carried by smoke.  Wear protective clothing.  If needed, protect your skin by wearing socks, boots, pants, long sleeves and vinyl gloves.  Wash your skin right away.  Washing off the oil with soap and water within 30 minutes of exposure may reduce your chances of getting a poison ivy rash.  Even washing after an hour or so can help reduce the severity of the rash.  If you walk through some poison ivy and then later touch your shoes, you may get some urushiol on your hands, which may then transfer to your face or body by touching or rubbing.  If the contaminated object isn't cleaned,  the urushiol on it can still cause a skin reaction years later.    Be careful not to reuse towels after you have washed your skin.  Also carefully wash clothing in detergent and hot water to remove all traces of the oil.  Handle contaminated clothing carefully so you don't transfer the urushiol to yourself, furniture, rugs or appliances.  Remember that pets can carry the oil on their fur and paws.  If you think your pet may be contaminated with urushiol, put on some long rubber gloves and give your pet a bath.  Finally, be careful not to burn these plants as the smoke can contain traces of the oil.  Inhaling the smoke may result in difficulty breathing. If that occurred you should see a physician as soon as possible.  See your doctor right away if:  The reaction is severe or widespread You inhaled the smoke from burning poison ivy and are having difficulty breathing Your skin continues to swell The rash affects your eyes, mouth or genitals Blisters are oozing pus You develop a fever greater than 100 F (37.8 C) The rash doesn't get better within a few weeks.  If you scratch the poison ivy rash, bacteria under your fingernails may cause the skin to become infected.  See your doctor if pus starts oozing from the blisters.  Treatment generally includes antibiotics.  Poison ivy treatments are usually limited to self-care methods.  And the rash typically goes away on its own in two to three weeks.     If the rash is widespread or results in a large number of blisters, your doctor may prescribe an oral corticosteroid, such as prednisone .  If a bacterial infection has developed at the rash site, your doctor may give you a prescription for an oral antibiotic.  MAKE SURE YOU  Understand these instructions. Will watch your condition. Will get help right away if you are not doing well or get worse.   Thank you for choosing an e-visit.  Your e-visit answers were reviewed by a board certified  advanced clinical practitioner to complete your personal care plan. Depending upon the condition, your plan could have included both over the counter or prescription medications.  Please review your pharmacy choice. Make sure the pharmacy is open so you can pick up prescription now. If there is a problem, you may contact your provider through [employer] and have the prescription routed to another pharmacy.  Your safety is important to us . If you have drug allergies check your prescription carefully.   For the next 24 hours you can use MyChart to ask questions about today's visit, request a non-urgent call back, or ask for a work or school excuse. You will get  an email in the next two days asking about your experience. I hope that your e-visit has been valuable and will speed your recovery.    I provided 5 minutes of non face-to-face time during this encounter for chart review, medication and order placement, as well as and documentation.

## 2024-06-07 ENCOUNTER — Telehealth: Admitting: Physician Assistant

## 2024-06-07 DIAGNOSIS — J019 Acute sinusitis, unspecified: Secondary | ICD-10-CM | POA: Diagnosis not present

## 2024-06-07 DIAGNOSIS — B9689 Other specified bacterial agents as the cause of diseases classified elsewhere: Secondary | ICD-10-CM | POA: Diagnosis not present

## 2024-06-07 MED ORDER — AMOXICILLIN-POT CLAVULANATE 875-125 MG PO TABS: 1.0000 | ORAL_TABLET | Freq: Two times a day (BID) | ORAL | 0 refills | Status: AC

## 2024-06-07 MED ORDER — FLUTICASONE PROPIONATE 50 MCG/ACT NA SUSP: 2.0000 | Freq: Every day | NASAL | 0 refills | Status: AC

## 2024-06-07 NOTE — Progress Notes (Signed)
 E-Visit for Sinus Problems  We are sorry that you are not feeling well.  Here is how we plan to help!  Based on what you have shared with me it looks like you have sinusitis.  Sinusitis is inflammation and infection in the sinus cavities of the head.  Based on your presentation I believe you most likely have Acute Bacterial Sinusitis.  This is an infection caused by bacteria and is treated with antibiotics. I have prescribed Augmentin  875mg /125mg  one tablet twice daily with food, for 7 days. and I have also prescribed Flonase  Nasal Spray Use 2 sprays in each nostril daily for 10-14 days (safe in breastfeeding, however, any antibiotic can cause thrush and GI upset in an infant so watch for signs of a painful mouth, not latching, or GI upset). You may use an oral decongestant such as Mucinex D or if you have glaucoma or high blood pressure use plain Mucinex. Saline nasal spray help and can safely be used as often as needed for congestion.  If you develop worsening sinus pain, fever or notice severe headache and vision changes, or if symptoms are not better after completion of antibiotic, please schedule an appointment with a health care provider.    Sinus infections are not as easily transmitted as other respiratory infection, however we still recommend that you avoid close contact with loved ones, especially the very young and elderly.  Remember to wash your hands thoroughly throughout the day as this is the number one way to prevent the spread of infection!  Home Care: Only take medications as instructed by your medical team. Complete the entire course of an antibiotic. Do not take these medications with alcohol. A steam or ultrasonic humidifier can help congestion.  You can place a towel over your head and breathe in the steam from hot water coming from a faucet. Avoid close contacts especially the very young and the elderly. Cover your mouth when you cough or sneeze. Always remember to wash your  hands.  Get Help Right Away If: You develop worsening fever or sinus pain. You develop a severe head ache or visual changes. Your symptoms persist after you have completed your treatment plan.  Make sure you Understand these instructions. Will watch your condition. Will get help right away if you are not doing well or get worse.  Your e-visit answers were reviewed by a board certified advanced clinical practitioner to complete your personal care plan.  Depending on the condition, your plan could have included both over the counter or prescription medications.  If there is a problem please reply  once you have received a response from your provider.  Your safety is important to us .  If you have drug allergies check your prescription carefully.    You can use MyChart to ask questions about today's visit, request a non-urgent call back, or ask for a work or school excuse for 24 hours related to this e-Visit. If it has been greater than 24 hours you will need to follow up with your provider, or enter a new e-Visit to address those concerns.  You will get an e-mail in the next two days asking about your experience.  I hope that your e-visit has been valuable and will speed your recovery. Thank you for using e-visits.  I have spent 5 minutes in review of e-visit questionnaire, review and updating patient chart, medical decision making and response to patient.   Sheri Wilson CHRISTELLA Dickinson, PA-C

## 2024-06-28 ENCOUNTER — Emergency Department (HOSPITAL_COMMUNITY)

## 2024-06-28 ENCOUNTER — Encounter (HOSPITAL_COMMUNITY): Payer: Self-pay | Admitting: Emergency Medicine

## 2024-06-28 ENCOUNTER — Other Ambulatory Visit: Payer: Self-pay

## 2024-06-28 ENCOUNTER — Emergency Department (HOSPITAL_COMMUNITY)
Admission: EM | Admit: 2024-06-28 | Discharge: 2024-06-28 | Disposition: A | Discharge status: Home / Self Care | Attending: Emergency Medicine | Admitting: Emergency Medicine

## 2024-06-28 DIAGNOSIS — Y9301 Activity, walking, marching and hiking: Secondary | ICD-10-CM | POA: Diagnosis not present

## 2024-06-28 DIAGNOSIS — X501XXA Overexertion from prolonged static or awkward postures, initial encounter: Secondary | ICD-10-CM | POA: Diagnosis not present

## 2024-06-28 DIAGNOSIS — S92352A Displaced fracture of fifth metatarsal bone, left foot, initial encounter for closed fracture: Secondary | ICD-10-CM | POA: Insufficient documentation

## 2024-06-28 DIAGNOSIS — M79672 Pain in left foot: Secondary | ICD-10-CM | POA: Diagnosis present

## 2024-06-28 MED ORDER — IBUPROFEN 400 MG PO TABS
600.0000 mg | ORAL_TABLET | Freq: Once | ORAL | Status: AC
  Administered 2024-06-28: 600 mg via ORAL
  Filled 2024-06-28: qty 2

## 2024-06-28 NOTE — ED Notes (Signed)
 X ray in room.

## 2024-06-28 NOTE — Discharge Instructions (Addendum)
 You were seen today for a fracture in your foot.  Wear the boot you have at home and use your crutches, you can bear weight as tolerated and follow-up with orthopedics.  Come back to the ER for new or worsening symptoms.

## 2024-06-28 NOTE — ED Triage Notes (Signed)
 Pt tripped and fell on left foot. Swelling noted. Unable to apply weight on foot. Denies any other pain or injuries

## 2024-06-28 NOTE — ED Provider Notes (Signed)
 [name] EMERGENCY DEPARTMENT AT [facility] Provider Note   CSN: [id number] Arrival date & time: 06/28/24  1310     Patient presents with: Foot Injury   Sheri Wilson is a 28 y.o. female.  She has history of anxiety, migraine.  Presents ER today complaining of left foot pain over the lateral aspect of the fifth metatarsal.  She was walking in twisted her foot and heard a snap and has been unable to bear weight, has significant swelling.  This happened about 2 hours prior to arrival, she denies numbness or tingling, states it hurts to move her toes but she is able to move them.  She had not take any medication.  She does know she is breast-feeding so wants to avoid narcotics.    Foot Injury      Prior to Admission medications   Medication Sig Start Date End Date Taking? Authorizing Provider  amoxicillin -clavulanate (AUGMENTIN ) 875-125 MG tablet Take 1 tablet by mouth 2 (two) times daily. 06/07/24   Vivienne Delon HERO, PA-C  fluticasone  (FLONASE ) 50 MCG/ACT nasal spray Place 2 sprays into both nostrils daily. 06/07/24   Vivienne Delon HERO, PA-C  Glutamine CRYS by Does not apply route.    [provider]  magnesium oxide (MAG-OX) 400 (240 Mg) MG tablet Take 400 mg by mouth daily.    [provider]  Multiple Vitamin (MULTIVITAMIN) tablet Take 1 tablet by mouth daily.    [provider]  Prenatal Vit-Fe Fumarate-FA (MULTIVITAMIN-PRENATAL) 27-0.8 MG TABS tablet Take 1 tablet by mouth daily at 12 noon.    [provider]    Allergies: Indomethacin, Sulfa antibiotics, and Topamax  [topiramate ]    Review of Systems  Updated Vital Signs BP 129/84   Pulse 82   Temp 98.9 F (37.2 C) (Oral)   Resp 18   Ht 5' 4 (1.626 m)   Wt 65.8 kg   LMP 06/22/2024 (Exact Date)   SpO2 98%   BMI 24.89 kg/m   Physical Exam Vitals and nursing note reviewed.  Constitutional:      General: She is not in acute distress.    Appearance: She is  well-developed.  HENT:     Head: Normocephalic and atraumatic.  Eyes:     Conjunctiva/sclera: Conjunctivae normal.  Cardiovascular:     Rate and Rhythm: Normal rate and regular rhythm.     Heart sounds: No murmur heard. Pulmonary:     Effort: Pulmonary effort is normal. No respiratory distress.     Breath sounds: Normal breath sounds.  Abdominal:     Palpations: Abdomen is soft.     Tenderness: There is no abdominal tenderness.  Musculoskeletal:        General: No swelling.     Cervical back: Neck supple.     Comments: Swelling noted over the lateral aspect of the right fifth metatarsal with significant tenderness in this area.  DP and TP dialysis are intact.  Capillary refill intact of the toes.  Normal sensation to light touch throughout the foot.  No tenderness over the Achilles or of the ankle.  Skin:    General: Skin is warm and dry.     Capillary Refill: Capillary refill takes less than 2 seconds.  Neurological:     General: No focal deficit present.     Mental Status: She is alert and oriented to person, place, and time.  Psychiatric:        Mood and Affect: Mood normal.     (  all labs ordered are listed, but only abnormal results are displayed) Labs Reviewed - No data to display  EKG: None  Radiology: No results found.   Procedures   Medications Ordered in the ED  ibuprofen  (ADVIL ) tablet 600 mg (has no administration in time range)                                    Medical Decision Making This patient presents to the ED for concern of foot pain left, this involves an extensive number of treatment options, and is a complaint that carries with it a high risk of complications and morbidity.  The differential diagnosis includes, fracture, strain, dislocation    Additional history obtained:  Additional history obtained from EMR External records from outside source obtained and reviewed including prior notes     Imaging Studies ordered:  I ordered  imaging studies including x-ray left foot I independently visualized and interpreted imaging which showed proximal fifth metatarsal fracture I agree with the radiologist interpretation     Problem List / ED Course / Critical interventions / Medication management  Patient presents with left foot pain after twisting injury-x-rays show proximal fifth metatarsal fracture, appears to be pseudo Jones fracture, discussed with patient plan of care will be crutches, walking boot, weightbearing as tolerated and follow-up with orthopedics.  Patient here did not want a boot or crutches as she has these at home from prior injuries able to show me the pictures of them and they are opiates generally used, so she was discharged home with a hard soled shoe and advised to wear the boot, use crutches and follow-up with orthopedics, advised on follow-up and return precautions. I ordered medication including ibuprofen   for pain  Reevaluation of the patient after these medicines showed that the patient improved I have reviewed the patients home medicines and have made adjustments as needed      Amount and/or Complexity of Data Reviewed Radiology: ordered.  Risk Prescription drug management.        Final diagnoses:  None    ED Discharge Orders     None          Suellen Sherran DELENA DEVONNA 06/28/24 1802    Yolande Lamar BROCKS, MD 06/29/24 [phone]
